# Patient Record
Sex: Female | Born: 1970 | Race: White | Hispanic: No | Marital: Married | Smoking: Never smoker
Health system: Southern US, Community
[De-identification: ages and names within clinical notes are randomized; demographics above are authoritative.]

## PROBLEM LIST (undated history)

## (undated) DIAGNOSIS — F419 Anxiety disorder, unspecified: Secondary | ICD-10-CM

## (undated) DIAGNOSIS — R51 Headache: Secondary | ICD-10-CM

## (undated) HISTORY — PX: OVARIAN CYST REMOVAL: SHX89

## (undated) HISTORY — PX: APPENDECTOMY: SHX54

---

## 2001-06-15 ENCOUNTER — Other Ambulatory Visit: Admission: RE | Admit: 2001-06-15 | Discharge: 2001-06-15 | Payer: Self-pay | Admitting: Obstetrics and Gynecology

## 2002-01-21 ENCOUNTER — Inpatient Hospital Stay (HOSPITAL_COMMUNITY): Admission: AD | Admit: 2002-01-21 | Discharge: 2002-01-23 | Payer: Self-pay | Admitting: Obstetrics and Gynecology

## 2002-03-03 ENCOUNTER — Other Ambulatory Visit: Admission: RE | Admit: 2002-03-03 | Discharge: 2002-03-03 | Payer: Self-pay | Admitting: Obstetrics and Gynecology

## 2002-04-21 ENCOUNTER — Inpatient Hospital Stay (HOSPITAL_COMMUNITY): Admission: RE | Admit: 2002-04-21 | Discharge: 2002-04-22 | Payer: Self-pay | Admitting: Obstetrics and Gynecology

## 2002-04-21 ENCOUNTER — Encounter (INDEPENDENT_AMBULATORY_CARE_PROVIDER_SITE_OTHER): Payer: Self-pay | Admitting: Specialist

## 2002-04-21 ENCOUNTER — Encounter (INDEPENDENT_AMBULATORY_CARE_PROVIDER_SITE_OTHER): Payer: Self-pay

## 2003-04-25 ENCOUNTER — Other Ambulatory Visit: Admission: RE | Admit: 2003-04-25 | Discharge: 2003-04-25 | Payer: Self-pay | Admitting: Obstetrics and Gynecology

## 2003-10-31 ENCOUNTER — Inpatient Hospital Stay (HOSPITAL_COMMUNITY): Admission: AD | Admit: 2003-10-31 | Discharge: 2003-11-02 | Payer: Self-pay | Admitting: Obstetrics and Gynecology

## 2007-10-27 ENCOUNTER — Encounter: Admission: RE | Admit: 2007-10-27 | Discharge: 2007-10-27 | Payer: Self-pay | Admitting: Obstetrics and Gynecology

## 2010-09-17 ENCOUNTER — Other Ambulatory Visit: Payer: Self-pay | Admitting: Obstetrics and Gynecology

## 2010-10-05 NOTE — Op Note (Signed)
   NAME:  Nina Fowler, Nina Fowler                         ACCOUNT NO.:  [id number]   MEDICAL RECORD NO.:  [id number]                   PATIENT TYPE:  INP   LOCATION:  9164                                 FACILITY:  [facility]   PHYSICIAN:  Lenoard Aden, M.D.             DATE OF BIRTH:  31-Jan-1971   DATE OF PROCEDURE:  01/21/2002  DATE OF DISCHARGE:                                 OPERATIVE REPORT   PREOPERATIVE DIAGNOSES:  1. Recurrent variable decelerations.  2. Maternal exhaustion.   POSTOPERATIVE DIAGNOSES:  1. Recurrent variable decelerations.  2. Maternal exhaustion.   DESCRIPTION OF PROCEDURE:  The fetal vertex at fully dilated OP and +3  station, straight OP.  Mityvac vacuum system offered due to persistent  variable decelerations and maternal exhaustion.  Risks and benefits of  vacuum again include scalp laceration, cephalohematoma, and possible  intracranial hemorrhage noted.  The patient and husband acknowledge and  desire to proceed.  The Mityvac mushroom cup is placed in the proper  location for two pulls over second-degree midline laceration of full term  living female, Apgars 9 and 10,  bulb suctioned on the perineum, nuchal cord  x1 loosely applied and not reduced.  No cervical laceration.  A second-  degree midline laceration was repaired with a 3-0 Vicryl Rapide without  complications.  Estimated blood loss 500 cc.  Mother and baby recovering  without difficulty.                                               Lenoard Aden, M.D.    RJT/MEDQ  D:  01/21/2002  T:  01/21/2002  Job:  [id number]

## 2010-10-05 NOTE — H&P (Signed)
NAME:  Nina Fowler, Nina Fowler                         ACCOUNT NO.:  [id number]   MEDICAL RECORD NO.:  [id number]                   PATIENT TYPE:  INP   LOCATION:  NA                                   [facility]AC[facility]   PHYSICIAN:  Juan H. Lily Peer, M.D.             DATE OF BIRTH:  1970-09-21   DATE OF ADMISSION:  04/12/2002  DATE OF DISCHARGE:                                HISTORY & PHYSICAL   CHIEF COMPLAINT:  1. Term intrauterine pregnancy at 39-5/7 weeks' gestation.  2. Suspected fetal macrosomia.  3. Maternal obesity.   HISTORY OF PRESENT ILLNESS:  The patient is a 40 year old gravida 3, para 2,  with an estimated date of confinement April 14, 2002, currently 39-5/7  weeks' gestation.  The patient presented to the office today, is instructed  for a prenatal visit but also for an ultrasound to determine estimated fetal  weight.  The patient herself weighs 347 pounds, had two previous normal  spontaneous vaginal deliveries of 8 pounds 10 ounces at 40 weeks and 8  pounds 14 ounces at 41 weeks with previous pregnancies.  An ultrasound today  demonstrated an estimated fetal weight of 4140 g in the 85th percentile for  40 weeks' gestation.  The AFI was normal and was 14.6 cm, the 60th  percentile for 40 weeks, vertex presentation.  The patient had had a  hemoglobin A1c early in her pregnancy due to a strong family history of  diabetes and due to herself being morbidly obese.  Her diabetes screen at 24  weeks was normal.  She also had history of hypothyroidism, for which she has  been on 75 mcg daily and has had her thyroid function tests periodically  during her pregnancy.  Anatomy screen on ultrasound early in her pregnancy  was reported to be normal.  CBC had a small sub __________ hematoma which  subsequently resolved by the 20 week ultrasound.   PAST MEDICAL HISTORY:  1. Previous normal spontaneous vaginal delivery, 8 pounds 10 ounces, at 40     weeks' gestation, female infant in  1997, and an 8 pound 14 ounce female at 41     weeks in 2000.  The patient is allergic to PENICILLIN.  Her GBS culture     was negative.  She is on Synthroid 75 mg q.d.  2. 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   REVIEW OF SYSTEMS:  See [place]ister form.   PHYSICAL EXAMINATION:  VITAL SIGNS:  The patient weighs 347 pounds.  Blood  pressure was 110/70.  HEENT:  Unremarkable.  NECK:  Supple.  Trachea in midline.  No carotid bruits.  No thyromegaly.  LUNGS:  Clear to auscultation.  No rhonchi or wheezes.  HEART:  Regular rate and rhythm.  No murmurs or gallops.  BREASTS:  Exam not done.  ABDOMEN:  Morbidly obese.  Difficult by Thayer Ohm maneuver to determine  presentation.  PELVIC:  Cervix was 1 cm, 50%, -3 station.  EXTREMITIES:  DTRs 1+, negative clonus.   LABORATORY DATA:  Prenatal labs:  A positive blood type, negative antibody  screen.  VDRL was nonreactive, hepatitis B surface antigen and HIV were  negative, rubella immune.  __________ was normal, diabetes screen was  normal.  Alpha- fetoprotein was normal.  GBS culture was negative.   ASSESSMENT:  A 40 year old gravida 3, para 2, at 39-5/7 weeks' gestation  being admitted for induction.  Patient with suspected fetal macrosomia at  4140 g (9 pounds 2 ounces) in the 85th percentile for 40 weeks.  The patient  was counseled as to the 15% variability that can occur in ultrasounds,  whether she may be over 9 pounds 2 ounces or under, and offers were given to  electively consider a cesarean section versus a trial of vaginal delivery.  She has delivered two large babies in the past, but not at this weight, and  is fully aware of potential risks to include shoulder dystocia which could  subsequently result in neurological problems, such as Erb palsy, also  cerebral palsy, difficult birth resulting in emergency cesarean section.  All of these issues were discussed with the patient and she is fully aware  and  wanted to go  ahead with a trial of labor.  Since her cervix is only 1  cm and 50% she will have Cervidil placed this evening and leave it on for 10-  12 hours and begin Pitocin induction through her IV in the morning.  All  questions were answered and they feel comfortable with this decision.   PLAN:  As per assessment above.                                               Juan H. Lily Peer, M.D.    JHF/MEDQ  D:  04/12/2002  T:  04/12/2002  Job:  [id number]

## 2010-10-05 NOTE — H&P (Signed)
   NAME:  Nina Fowler, Nina Fowler                         ACCOUNT NO.:  [id number]   MEDICAL RECORD NO.:  [id number]                   PATIENT TYPE:  INP   LOCATION:  9164                                 FACILITY:  [facility]   PHYSICIAN:  Lenoard Aden, M.D.             DATE OF BIRTH:  September 01, 1970   DATE OF ADMISSION:  01/21/2002  DATE OF DISCHARGE:                                HISTORY & PHYSICAL   CHIEF COMPLAINT:  Oligohydramnios.   HISTORY OF PRESENT ILLNESS:  A 40 year old white female, G-1, P-0 expected  day of delivery 01/24/2002 39+ weeks with oligohydramnios.   PAST MEDICAL HISTORY:  Noncontributory except for migraine headaches. No  medical or surgical hospitalizations outside of pregnancy.   FAMILY HISTORY:  Noncontributory.   ALLERGIES:  No known drug allergies.   MEDICATIONS:  Prenatal vitamins.   COMPLICATIONS:  Pregnancy complicated by a stable 10 x 12 cm simple ovarian  cyst which is persistent and noted on repeat ultrasound surveillance.  No  evidence of internal/external excrescences noted diagnosed during pregnancy.   PHYSICAL EXAMINATION:  On physical exam she is a well-developed, well-  nourished white female in no apparent distress.  HEENT:  Normal.  LUNGS:  Clear.  HEART:  Regular rate and rhythm.  ABDOMEN:  Soft, gravid, nontender.  Estimated fetal weight 7-1/2 pounds.  Cervix is 2 cm, 90% vertex, 0 expression.  EXTREMITIES:  No cords in the upper extremities.  NEUROLOGIC:  Nonfocal.   IMPRESSION:  1. A 39 week intrauterine pregnancy.  2. Oligohydramnios.  3. Persistent simple ovarian mass.   PLAN:  Proceed with labor induction and C-section. When this is performed we  will address ovarian cyst. Will otherwise follow up with ultrasound in two  to three months postpartum and management assessed as needed.                                               Lenoard Aden, M.D.   RJT/MEDQ  D:  01/21/2002  T:  01/21/2002  Job:  [id number]

## 2010-10-05 NOTE — H&P (Signed)
   NAME:  Nina Fowler, Nina Fowler                         ACCOUNT NO.:  [id number]   MEDICAL RECORD NO.:  [id number]                   PATIENT TYPE:  INP   LOCATION:  NA                                   [facility]   PHYSICIAN:  Lenoard Aden, M.D.             DATE OF BIRTH:  Jun 23, 1970   DATE OF ADMISSION:  04/21/2002  DATE OF DISCHARGE:                                HISTORY & PHYSICAL   CHIEF COMPLAINT:  Ovarian mass.   HISTORY OF PRESENT ILLNESS:  The patient is a 40 year old, white female, G1,  P1, status post vaginal delivery in September 2003, with persistent ovarian  mass for definitive evaluation.   PAST MEDICAL HISTORY:  1. Spontaneous vaginal delivery.  2. Migraine headaches.   PAST GYNECOLOGIC HISTORY:  Long-term pregnancy history dating back to  January with persistent ovarian mass 12 x 14 cm in size, simple appearing  that appears to be emanating from the right ovary.  Normal size uterus and  left ovary are visualized.   FAMILY HISTORY:  Noncontributory.   ALLERGIES:  No known drug allergies.   MEDICATIONS:  Prenatal vitamins.   PHYSICAL EXAMINATION:  GENERAL:  She is a well-developed, well-nourished,  white female in no apparent distress.  HEENT:  Normal.  LUNGS:  Clear.  HEART:  Regular rate and rhythm.  ABDOMEN:  Soft, nontender.  PELVIC:  Midline pelvic mass measuring about 10 x 14 cm in size.  EXTREMITIES:  No cords.  NEUROLOGIC:  Nonfocal.   IMPRESSION:  Persistent ovarian mass with questionable benign etiology.   PLAN:  Proceed with exploratory laparotomy, ovarian cystectomy versus  salpingo-oophorectomy.  Risks of anesthesia, infection, bleeding, injury to  abdominal organs were discussed.  Delayed risks and complications include  bowel and bladder injury and need for repair noted.  The patient  acknowledges and wishes to proceed.                                                Lenoard Aden, M.D.   RJT/MEDQ  D:  04/21/2002  T:   04/21/2002  Job:  [id number]

## 2010-10-05 NOTE — H&P (Signed)
NAME:  Nina Fowler, Nina Fowler                         ACCOUNT NO.:  [id number]   MEDICAL RECORD NO.:  [id number]                   PATIENT TYPE:  INP   LOCATION:  9165                                 [facility]ILITY[facility]   PHYSICIAN:  Lenoard Aden, M.D.             DATE OF BIRTH:  April 16, 1971   DATE OF ADMISSION:  10/31/2003  DATE OF DISCHARGE:                                HISTORY & PHYSICAL   CHIEF COMPLAINT:  Rule out LGA, for induction.   HISTORY OF PRESENT ILLNESS:  The patient is a 40 year old white female G2,  P53, EDD of November 10, 2003 at 38+ weeks with an estimated fetal weight greater  than the 90th percentile who presents for induction of labor.   History of spontaneous vaginal delivery 7 pounds, 11 ounce female 2003.  History of right oophorectomy for benign ovarian cyst, persistent in 12/03.   ALLERGIES:  NO KNOWN DRUG ALLERGIES.   MEDICATIONS:  Prenatal vitamins.   PAST MEDICAL HISTORY:  No other medical or surgical hospitalizations, other  than previous described outside pregnancy.  Pregnancy course complicated  only by an estimated fetal weight greater than the 90th percentile.   PHYSICAL EXAMINATION:  GENERAL:  She is a well-developed, well-nourished  white female in no acute distress.  LUNGS:  Clear.  CARDIOVASCULAR:  Regular rate and rhythm.  ABDOMEN:  Soft, gravida nontender.  Estimated fetal weight 8-1/2 pounds.  PELVIC:  Cervix is 3-4 cm, 80% vertex and -1.  EXTREMITIES:  No cords.  NEUROLOGIC:  Nonfocal.   IMPRESSION:  1. A 38 weeks' intrauterine pregnancy.  2. Presumed large for gestational age, for induction.   PLAN:  Proceed with inductions. Risks and benefits were discussed, potential  inaccuracy of estimated fetal weight by ultrasound, discussed with patient  and husband. Due to favorable cervix, they wished to proceed.                                               Lenoard Aden, M.D.    RJT/MEDQ  D:  10/31/2003  T:  10/31/2003  Job:  [id number]   cc:   [facility]

## 2010-10-05 NOTE — Op Note (Signed)
NAME:  XOCHILTH, STANDISH                         ACCOUNT NO.:  [id number]   MEDICAL RECORD NO.:  [id number]                   PATIENT TYPE:  INP   LOCATION:  9313                                 [facility]   PHYSICIAN:  Lenoard Aden, M.D.             DATE OF BIRTH:  [birth year]   DATE OF PROCEDURE:  04/21/2002  DATE OF DISCHARGE:                                 OPERATIVE REPORT   CHIEF COMPLAINT:  Pelvic mass, right ovarian cyst, postoperative right  mucinous cystadenoma.   PROCEDURE:  Exploratory laparotomy, pelvic washing, cyst aspiration, right  oophorectomy.   SURGEON:  Lenoard Aden, M.D.   ASSISTANT:  Pershing Cox, M.D.   ESTIMATED BLOOD LOSS:  Less than 50 cc.   COMPLICATIONS:  None.   DRAINS:  Foley catheter.   COUNTS:  Correct.   DISPOSITION:  The patient was taken to the recovery room in good condition.   DESCRIPTION OF PROCEDURE:  After being apprised of the risks of anesthesia,  infection, bleeding, injury to abdominal organs with need for repair, the  patient was brought to the operating room where she was administered general  anesthetic without complications, prepped and draped in sterile fashion,  Foley catheter placed.  After achieving adequate anesthesia, the Marcaine  solution placed in the area of a small midline incision which is moved with  a scalpel, carried down to the fascia, nicked in the midline, opened  vertically using Mayo scissors.  Rectus muscle was dissected sharply.  Peritoneum entered sharply.  Pelvic washing collecting using 500 to 600 cc  of lactated Ringers solution, collected with pool suctioning, 400 cc  collected total.  Mass noted to be tenting against the anterior abdominal  wall.  After pursestring was placed using 2-0 Vicryl suture and paracentesis  needle was placed and aspiration of mucinous appearing clear fluid  approximately 400 cc was aspirated and sent to pathology for cytology.  The  cyst is then  elevated out of a small midline incision and found to be  emanating from the right ovary.  Right ovarian normal tissue is not  identified.  Normal right tube, normal left tube, normal left ovary, normal  sized uterus.  No evidence of excrescences or pelvic implants at this time.  The base of the ovary is clamped using two Kelly clamps, removed and suture  ligated. The base is cauterized using electrocautery.  Good hemostasis is  noted.  Normal right tube is noted.  Normal left tube and ovary as  previously noted above.  Irrigation is accomplished.  Fascia closed using 0  Vicryl in tense running fashion.  Skin closed using 5-0 Monocryl.  Steri-  Strips placed.  The patient tolerated the procedure well and was transferred  to recovery room in good condition.  Lenoard Aden, M.D.     RJT/MEDQ  D:  04/21/2002  T:  04/21/2002  Job:  [id number]

## 2012-09-23 ENCOUNTER — Ambulatory Visit: Payer: Self-pay | Admitting: Obstetrics and Gynecology

## 2012-09-29 ENCOUNTER — Ambulatory Visit: Payer: Self-pay | Admitting: Obstetrics and Gynecology

## 2013-02-25 ENCOUNTER — Inpatient Hospital Stay (HOSPITAL_COMMUNITY): Admission: AD | Admit: 2013-02-25 | Payer: Self-pay | Source: Ambulatory Visit | Admitting: Obstetrics and Gynecology

## 2013-02-25 LAB — OB RESULTS CONSOLE GC/CHLAMYDIA
CHLAMYDIA, DNA PROBE: NEGATIVE
GC PROBE AMP, GENITAL: NEGATIVE

## 2013-03-03 LAB — OB RESULTS CONSOLE ABO/RH: RH Type: POSITIVE

## 2013-03-03 LAB — OB RESULTS CONSOLE ANTIBODY SCREEN: ANTIBODY SCREEN: NEGATIVE

## 2013-03-03 LAB — OB RESULTS CONSOLE RUBELLA ANTIBODY, IGM: Rubella: IMMUNE

## 2013-03-03 LAB — OB RESULTS CONSOLE HIV ANTIBODY (ROUTINE TESTING): HIV: NONREACTIVE

## 2013-03-03 LAB — OB RESULTS CONSOLE HEPATITIS B SURFACE ANTIGEN: HEP B S AG: NEGATIVE

## 2013-03-03 LAB — OB RESULTS CONSOLE RPR: RPR: NONREACTIVE

## 2013-05-20 NOTE — L&D Delivery Note (Signed)
Delivery Note At 11:11 AM a viable and healthy female was delivered via  (Presentation:ROA ).  APGAR: 9,9 ; weight pending.   Placenta status: spontaneous, intact.  Cord:  with the following complications: na.  Cord pH: na  Anesthesia: Epidural  Episiotomy: none Lacerations: second, periurrethral Suture Repair: 2.0 vicryl rapide Est. Blood Loss (mL): 200  Mom to postpartum.  Baby to Couplet care / Skin to Skin.  Nina Fowler 09/12/2013, 11:27 AM

## 2013-07-13 ENCOUNTER — Ambulatory Visit: Payer: Self-pay | Admitting: Internal Medicine

## 2013-07-13 LAB — RAPID STREP-A WITH REFLX: Micro Text Report: NEGATIVE

## 2013-07-17 LAB — BETA STREP CULTURE(ARMC)

## 2013-08-10 LAB — OB RESULTS CONSOLE GBS: STREP GROUP B AG: POSITIVE

## 2013-08-31 ENCOUNTER — Encounter (HOSPITAL_COMMUNITY): Payer: Self-pay | Admitting: *Deleted

## 2013-08-31 ENCOUNTER — Telehealth (HOSPITAL_COMMUNITY): Payer: Self-pay | Admitting: *Deleted

## 2013-08-31 NOTE — Telephone Encounter (Signed)
Preadmission screen  

## 2013-09-01 ENCOUNTER — Other Ambulatory Visit: Payer: Self-pay | Admitting: Obstetrics and Gynecology

## 2013-09-11 ENCOUNTER — Encounter (HOSPITAL_COMMUNITY): Payer: Self-pay

## 2013-09-11 ENCOUNTER — Inpatient Hospital Stay (HOSPITAL_COMMUNITY)
Admission: RE | Admit: 2013-09-11 | Discharge: 2013-09-14 | DRG: 775 | Disposition: A | Discharge status: Home / Self Care | Payer: BC Managed Care – PPO | Source: Ambulatory Visit | Attending: Obstetrics and Gynecology | Admitting: Obstetrics and Gynecology

## 2013-09-11 DIAGNOSIS — O3660X0 Maternal care for excessive fetal growth, unspecified trimester, not applicable or unspecified: Secondary | ICD-10-CM | POA: Diagnosis present

## 2013-09-11 DIAGNOSIS — O99892 Other specified diseases and conditions complicating childbirth: Secondary | ICD-10-CM | POA: Diagnosis present

## 2013-09-11 DIAGNOSIS — O09529 Supervision of elderly multigravida, unspecified trimester: Secondary | ICD-10-CM | POA: Diagnosis present

## 2013-09-11 DIAGNOSIS — Z3490 Encounter for supervision of normal pregnancy, unspecified, unspecified trimester: Secondary | ICD-10-CM

## 2013-09-11 DIAGNOSIS — Z22330 Carrier of Group B streptococcus: Secondary | ICD-10-CM

## 2013-09-11 DIAGNOSIS — O9989 Other specified diseases and conditions complicating pregnancy, childbirth and the puerperium: Secondary | ICD-10-CM

## 2013-09-11 DIAGNOSIS — O409XX0 Polyhydramnios, unspecified trimester, not applicable or unspecified: Secondary | ICD-10-CM | POA: Diagnosis present

## 2013-09-11 HISTORY — DX: Anxiety disorder, unspecified: F41.9

## 2013-09-11 HISTORY — DX: Headache: R51

## 2013-09-11 LAB — CBC
HCT: 37.7 % (ref 36.0–46.0)
Hemoglobin: 13 g/dL (ref 12.0–15.0)
MCH: 33 pg (ref 26.0–34.0)
MCHC: 34.5 g/dL (ref 30.0–36.0)
MCV: 95.7 fL (ref 78.0–100.0)
PLATELETS: 149 10*3/uL — AB (ref 150–400)
RBC: 3.94 MIL/uL (ref 3.87–5.11)
RDW: 13.8 % (ref 11.5–15.5)
WBC: 10.2 10*3/uL (ref 4.0–10.5)

## 2013-09-11 MED ORDER — LIDOCAINE HCL (PF) 1 % IJ SOLN
30.0000 mL | INTRAMUSCULAR | Status: DC | PRN
  Filled 2013-09-11: qty 30

## 2013-09-11 MED ORDER — FLEET ENEMA 7-19 GM/118ML RE ENEM: 1.0000 | ENEMA | RECTAL | Status: DC | PRN

## 2013-09-11 MED ORDER — ZOLPIDEM TARTRATE 5 MG PO TABS: 5.0000 mg | ORAL_TABLET | Freq: Every evening | ORAL | Status: DC | PRN

## 2013-09-11 MED ORDER — OXYTOCIN BOLUS FROM INFUSION: 500.0000 mL | INTRAVENOUS | Status: DC

## 2013-09-11 MED ORDER — CITRIC ACID-SODIUM CITRATE 334-500 MG/5ML PO SOLN: 30.0000 mL | ORAL | Status: DC | PRN

## 2013-09-11 MED ORDER — ACETAMINOPHEN 325 MG PO TABS: 650.0000 mg | ORAL_TABLET | ORAL | Status: DC | PRN

## 2013-09-11 MED ORDER — IBUPROFEN 600 MG PO TABS: 600.0000 mg | ORAL_TABLET | Freq: Four times a day (QID) | ORAL | Status: DC | PRN

## 2013-09-11 MED ORDER — LACTATED RINGERS IV SOLN: 500.0000 mL | INTRAVENOUS | Status: DC | PRN

## 2013-09-11 MED ORDER — PENICILLIN G POTASSIUM 5000000 UNITS IJ SOLR
2.5000 10*6.[IU] | INTRAVENOUS | Status: DC
  Administered 2013-09-12: 2.5 10*6.[IU] via INTRAVENOUS
  Filled 2013-09-11 (×5): qty 2.5

## 2013-09-11 MED ORDER — TERBUTALINE SULFATE 1 MG/ML IJ SOLN: 0.2500 mg | Freq: Once | INTRAMUSCULAR | Status: AC | PRN

## 2013-09-11 MED ORDER — OXYTOCIN 40 UNITS IN LACTATED RINGERS INFUSION - SIMPLE MED: 62.5000 mL/h | INTRAVENOUS | Status: DC

## 2013-09-11 MED ORDER — OXYTOCIN 40 UNITS IN LACTATED RINGERS INFUSION - SIMPLE MED
1.0000 m[IU]/min | INTRAVENOUS | Status: DC
  Administered 2013-09-12: 8 m[IU]/min via INTRAVENOUS
  Administered 2013-09-12: 2 m[IU]/min via INTRAVENOUS
  Administered 2013-09-12: 666 m[IU]/min via INTRAVENOUS
  Administered 2013-09-12: 10 m[IU]/min via INTRAVENOUS
  Filled 2013-09-11: qty 1000

## 2013-09-11 MED ORDER — ONDANSETRON HCL 4 MG/2ML IJ SOLN: 4.0000 mg | Freq: Four times a day (QID) | INTRAMUSCULAR | Status: DC | PRN

## 2013-09-11 MED ORDER — OXYCODONE-ACETAMINOPHEN 5-325 MG PO TABS: 1.0000 | ORAL_TABLET | ORAL | Status: DC | PRN

## 2013-09-11 MED ORDER — PENICILLIN G POTASSIUM 5000000 UNITS IJ SOLR
5.0000 10*6.[IU] | Freq: Once | INTRAVENOUS | Status: AC
  Administered 2013-09-12: 5 10*6.[IU] via INTRAVENOUS
  Filled 2013-09-11: qty 5

## 2013-09-11 MED ORDER — MISOPROSTOL 25 MCG QUARTER TABLET
25.0000 ug | ORAL_TABLET | ORAL | Status: DC | PRN
  Administered 2013-09-11 – 2013-09-12 (×2): 25 ug via VAGINAL
  Filled 2013-09-11: qty 0.25
  Filled 2013-09-11: qty 1
  Filled 2013-09-11: qty 0.25

## 2013-09-11 MED ORDER — LACTATED RINGERS IV SOLN
INTRAVENOUS | Status: DC
  Administered 2013-09-11 – 2013-09-12 (×2): via INTRAVENOUS

## 2013-09-11 NOTE — H&P (Signed)
Nina Fowler is a 43 y.o. female presenting for induction due to AMA > 40 at 39+ weeks. Maternal Medical History:  Contractions: Frequency: rare.   Perceived severity is mild.    Fetal activity: Perceived fetal activity is normal.   Last perceived fetal movement was within the past hour.    Prenatal complications: no prenatal complications Prenatal Complications - Diabetes: none.    OB History   Grav Para Term Preterm Abortions TAB SAB Ect Mult Living   3 2 2       2      No past medical history on file. No past surgical history on file. Family History: family history includes Cancer in her brother and maternal grandfather; Hypertension in her father and mother; Lupus in her brother; Stroke in her father. Social History:  reports that she has never smoked. She has never used smokeless tobacco. She reports that she does not drink alcohol or use illicit drugs.   Prenatal Transfer Tool  Maternal Diabetes: No Genetic Screening: Normal Maternal Ultrasounds/Referrals: Normal Fetal Ultrasounds or other Referrals:  None Maternal Substance Abuse:  No Significant Maternal Medications:  None Significant Maternal Lab Results:  None Other Comments:  None  Review of Systems  All other systems reviewed and are negative.     Last menstrual period 12/20/2012. Maternal Exam:  Uterine Assessment: Contraction strength is mild.  Contraction frequency is rare.   Abdomen: Patient reports no abdominal tenderness. Fetal presentation: vertex  Introitus: Normal vulva. Normal vagina.  Ferning test: not done.  Nitrazine test: not done. Amniotic fluid character: not assessed.  Pelvis: adequate for delivery.   Cervix: Cervix evaluated by digital exam.     Physical Exam  Constitutional: She is oriented to person, place, and time. She appears well-developed and well-nourished.  Eyes: Pupils are equal, round, and reactive to light.  Neck: Normal range of motion.  Cardiovascular: Normal rate  and regular rhythm.   Respiratory: Effort normal and breath sounds normal.  GI: Soft. Bowel sounds are normal.  Genitourinary: Vagina normal and uterus normal.  Musculoskeletal: Normal range of motion.  Neurological: She is alert and oriented to person, place, and time. She has normal reflexes.  Skin: Skin is warm and dry.  Psychiatric: She has a normal mood and affect.    Prenatal labs: ABO, Rh: O/Positive/-- (10/15 0000) Antibody: Negative (10/15 0000) Rubella: Immune (10/15 0000) RPR: Nonreactive (10/15 0000)  HBsAg: Negative (10/15 0000)  HIV: Non-reactive (10/15 0000)  GBS: Positive (03/24 0000)   Assessment/Plan: 39 weeks AMA LGA GBS positive Cytotec then Pitocin in AM   Rhythm Gubbels J Terrall Bley 09/11/2013, 7:55 PM

## 2013-09-12 ENCOUNTER — Encounter (HOSPITAL_COMMUNITY): Payer: Self-pay

## 2013-09-12 ENCOUNTER — Encounter (HOSPITAL_COMMUNITY): Payer: BC Managed Care – PPO | Admitting: Anesthesiology

## 2013-09-12 ENCOUNTER — Inpatient Hospital Stay (HOSPITAL_COMMUNITY): Payer: BC Managed Care – PPO | Admitting: Anesthesiology

## 2013-09-12 DIAGNOSIS — O409XX0 Polyhydramnios, unspecified trimester, not applicable or unspecified: Secondary | ICD-10-CM | POA: Diagnosis present

## 2013-09-12 LAB — RPR

## 2013-09-12 LAB — ABO/RH: ABO/RH(D): O POS

## 2013-09-12 MED ORDER — PRENATAL MULTIVITAMIN CH
1.0000 | ORAL_TABLET | Freq: Every day | ORAL | Status: DC
  Administered 2013-09-13 – 2013-09-14 (×2): 1 via ORAL
  Filled 2013-09-12 (×2): qty 1

## 2013-09-12 MED ORDER — EPHEDRINE 5 MG/ML INJ: 10.0000 mg | INTRAVENOUS | Status: DC | PRN

## 2013-09-12 MED ORDER — FENTANYL 2.5 MCG/ML BUPIVACAINE 1/10 % EPIDURAL INFUSION (WH - ANES)
INTRAMUSCULAR | Status: DC | PRN
  Administered 2013-09-12: 14 mL/h via EPIDURAL

## 2013-09-12 MED ORDER — LANOLIN HYDROUS EX OINT: TOPICAL_OINTMENT | CUTANEOUS | Status: DC | PRN

## 2013-09-12 MED ORDER — PHENYLEPHRINE 40 MCG/ML (10ML) SYRINGE FOR IV PUSH (FOR BLOOD PRESSURE SUPPORT): 80.0000 ug | PREFILLED_SYRINGE | INTRAVENOUS | Status: DC | PRN

## 2013-09-12 MED ORDER — METHYLERGONOVINE MALEATE 0.2 MG/ML IJ SOLN: 0.2000 mg | INTRAMUSCULAR | Status: DC | PRN

## 2013-09-12 MED ORDER — FENTANYL 2.5 MCG/ML BUPIVACAINE 1/10 % EPIDURAL INFUSION (WH - ANES)
14.0000 mL/h | INTRAMUSCULAR | Status: DC | PRN
  Filled 2013-09-12: qty 125

## 2013-09-12 MED ORDER — DIPHENHYDRAMINE HCL 50 MG/ML IJ SOLN: 12.5000 mg | INTRAMUSCULAR | Status: DC | PRN

## 2013-09-12 MED ORDER — WITCH HAZEL-GLYCERIN EX PADS
1.0000 "application " | MEDICATED_PAD | CUTANEOUS | Status: DC | PRN
  Administered 2013-09-12 – 2013-09-13 (×2): 1 via TOPICAL

## 2013-09-12 MED ORDER — EPHEDRINE 5 MG/ML INJ
10.0000 mg | INTRAVENOUS | Status: DC | PRN
  Filled 2013-09-12: qty 2
  Filled 2013-09-12: qty 4

## 2013-09-12 MED ORDER — OXYCODONE-ACETAMINOPHEN 5-325 MG PO TABS: 1.0000 | ORAL_TABLET | ORAL | Status: DC | PRN

## 2013-09-12 MED ORDER — LACTATED RINGERS IV SOLN
500.0000 mL | Freq: Once | INTRAVENOUS | Status: AC
  Administered 2013-09-12: 500 mL via INTRAVENOUS

## 2013-09-12 MED ORDER — DIPHENHYDRAMINE HCL 25 MG PO CAPS: 25.0000 mg | ORAL_CAPSULE | Freq: Four times a day (QID) | ORAL | Status: DC | PRN

## 2013-09-12 MED ORDER — IBUPROFEN 600 MG PO TABS
600.0000 mg | ORAL_TABLET | Freq: Four times a day (QID) | ORAL | Status: DC
  Administered 2013-09-12 – 2013-09-14 (×8): 600 mg via ORAL
  Filled 2013-09-12 (×8): qty 1

## 2013-09-12 MED ORDER — ONDANSETRON HCL 4 MG/2ML IJ SOLN: 4.0000 mg | INTRAMUSCULAR | Status: DC | PRN

## 2013-09-12 MED ORDER — FENTANYL 2.5 MCG/ML BUPIVACAINE 1/10 % EPIDURAL INFUSION (WH - ANES): 14.0000 mL/h | INTRAMUSCULAR | Status: DC | PRN

## 2013-09-12 MED ORDER — LACTATED RINGERS IV SOLN: 500.0000 mL | Freq: Once | INTRAVENOUS | Status: DC

## 2013-09-12 MED ORDER — PHENYLEPHRINE 40 MCG/ML (10ML) SYRINGE FOR IV PUSH (FOR BLOOD PRESSURE SUPPORT)
80.0000 ug | PREFILLED_SYRINGE | INTRAVENOUS | Status: DC | PRN
  Filled 2013-09-12: qty 2

## 2013-09-12 MED ORDER — DIBUCAINE 1 % RE OINT
1.0000 "application " | TOPICAL_OINTMENT | RECTAL | Status: DC | PRN
  Administered 2013-09-13: 1 via RECTAL
  Filled 2013-09-12: qty 28

## 2013-09-12 MED ORDER — SIMETHICONE 80 MG PO CHEW: 80.0000 mg | CHEWABLE_TABLET | ORAL | Status: DC | PRN

## 2013-09-12 MED ORDER — SENNOSIDES-DOCUSATE SODIUM 8.6-50 MG PO TABS
2.0000 | ORAL_TABLET | ORAL | Status: DC
  Administered 2013-09-12 – 2013-09-13 (×2): 2 via ORAL
  Filled 2013-09-12 (×2): qty 2

## 2013-09-12 MED ORDER — ONDANSETRON HCL 4 MG PO TABS: 4.0000 mg | ORAL_TABLET | ORAL | Status: DC | PRN

## 2013-09-12 MED ORDER — ZOLPIDEM TARTRATE 5 MG PO TABS: 5.0000 mg | ORAL_TABLET | Freq: Every evening | ORAL | Status: DC | PRN

## 2013-09-12 MED ORDER — BENZOCAINE-MENTHOL 20-0.5 % EX AERO
1.0000 "application " | INHALATION_SPRAY | CUTANEOUS | Status: DC | PRN
  Administered 2013-09-12: 1 via TOPICAL
  Filled 2013-09-12: qty 56

## 2013-09-12 MED ORDER — TETANUS-DIPHTH-ACELL PERTUSSIS 5-2.5-18.5 LF-MCG/0.5 IM SUSP: 0.5000 mL | Freq: Once | INTRAMUSCULAR | Status: DC

## 2013-09-12 MED ORDER — LIDOCAINE HCL (PF) 1 % IJ SOLN
INTRAMUSCULAR | Status: DC | PRN
  Administered 2013-09-12 (×2): 4 mL

## 2013-09-12 MED ORDER — EPHEDRINE 5 MG/ML INJ
10.0000 mg | INTRAVENOUS | Status: DC | PRN
  Filled 2013-09-12: qty 4
  Filled 2013-09-12: qty 2

## 2013-09-12 MED ORDER — METHYLERGONOVINE MALEATE 0.2 MG PO TABS: 0.2000 mg | ORAL_TABLET | ORAL | Status: DC | PRN

## 2013-09-12 MED ORDER — PHENYLEPHRINE 40 MCG/ML (10ML) SYRINGE FOR IV PUSH (FOR BLOOD PRESSURE SUPPORT)
80.0000 ug | PREFILLED_SYRINGE | INTRAVENOUS | Status: DC | PRN
  Filled 2013-09-12: qty 2
  Filled 2013-09-12: qty 10

## 2013-09-12 NOTE — Anesthesia Procedure Notes (Signed)
Epidural Patient location during procedure: OB Start time: 09/12/2013 8:45 AM End time: 09/12/2013 8:55 AM  Staffing Anesthesiologist: Lewie LoronGERMEROTH, Kristyanna Barcelo R Performed by: anesthesiologist   Preanesthetic Checklist Completed: patient identified, pre-op evaluation, timeout performed, IV checked, risks and benefits discussed and monitors and equipment checked  Epidural Patient position: sitting Prep: site prepped and draped and DuraPrep Patient monitoring: heart rate Approach: midline Injection technique: LOR air and LOR saline  Needle:  Needle type: Tuohy  Needle gauge: 17 G Needle length: 9 cm Needle insertion depth: 6 cm Catheter type: closed end flexible Catheter size: 19 Gauge Catheter at skin depth: 12 cm Test dose: negative  Assessment Sensory level: T8 Events: blood not aspirated, injection not painful, no injection resistance, negative IV test and no paresthesia  Additional Notes Reason for block:procedure for pain

## 2013-09-12 NOTE — Anesthesia Preprocedure Evaluation (Signed)
Anesthesia Evaluation  Patient identified by MRN, date of birth, ID band Patient awake    Reviewed: Allergy & Precautions, H&P , NPO status , Patient's Chart, lab work & pertinent test results  Airway Mallampati: II TM Distance: >3 FB     Dental   Pulmonary neg pulmonary ROS,          Cardiovascular negative cardio ROS  Rhythm:Regular     Neuro/Psych  Headaches, negative psych ROS   GI/Hepatic negative GI ROS, Neg liver ROS,   Endo/Other  negative endocrine ROS  Renal/GU negative Renal ROS  negative genitourinary   Musculoskeletal negative musculoskeletal ROS (+)   Abdominal   Peds negative pediatric ROS (+)  Hematology negative hematology ROS (+)   Anesthesia Other Findings   Reproductive/Obstetrics (+) Pregnancy                           Anesthesia Physical Anesthesia Plan  ASA: II  Anesthesia Plan: Epidural   Post-op Pain Management:    Induction:   Airway Management Planned:   Additional Equipment:   Intra-op Plan:   Post-operative Plan:   Informed Consent: I have reviewed the patients History and Physical, chart, labs and discussed the procedure including the risks, benefits and alternatives for the proposed anesthesia with the patient or authorized representative who has indicated his/her understanding and acceptance.     Plan Discussed with:   Anesthesia Plan Comments:         Anesthesia Quick Evaluation

## 2013-09-12 NOTE — Lactation Note (Signed)
This note was copied from the chart of Nina Stefano GaulSherry Maxham. Lactation Consultation Note  Patient Name: Nina Stefano GaulSherry Fowler ZOXWR'UToday's Date: 09/12/2013 Reason for consult: Other (Comment) (exclusion note)   Maternal Data Formula Feeding for Exclusion: Yes Reason for exclusion: Mother's choice to formula feed on admision  Feeding Feeding Type: Bottle Fed - Formula Nipple Type: Regular  LATCH Score/Interventions                      Lactation Tools Discussed/Used     Consult Status      Alfred LevinsChristine Anne Mckala Pantaleon 09/12/2013, 2:19 PM

## 2013-09-12 NOTE — Progress Notes (Signed)
Stefano GaulSherry Mullett is a 43 y.o. G3P2002 at 63104w5d by LMP admitted for induction of labor due to [facility] Al[facility] LLCMA and poly.  Subjective: Getting comfortable with epidural  Objective: BP 139/92  Pulse 80  Temp(Src) 98 F (36.7 C) (Oral)  Resp 18  Ht 5' 5.5" (1.664 m)  Wt 82.555 kg (182 lb)  BMI 29.82 kg/m2  SpO2 99%  LMP 12/20/2012      FHT:  FHR: 145 bpm, variability: moderate,  accelerations:  Present,  decelerations:  Absent UC:   regular, every 4 minutes SVE:   Dilation: 3 Effacement (%): 60;50 Station: -2 Exam by:: B.Cagna,RN  Labs: Lab Results  Component Value Date   WBC 10.2 09/11/2013   HGB 13.0 09/11/2013   HCT 37.7 09/11/2013   MCV 95.7 09/11/2013   PLT 149* 09/11/2013    Assessment / Plan: Induction of labor due to AMA and poly,  progressing well on pitocin  Labor: Progressing on Pitocin, will continue to increase then AROM Preeclampsia:  no signs or symptoms of toxicity Fetal Wellbeing:  Category I Pain Control:  Epidural I/D:  n/a Anticipated MOD:  NSVD  Lenoard AdenRichard J Judy Pollman 09/12/2013, 9:24 AM

## 2013-09-13 LAB — CBC
HCT: 36.7 % (ref 36.0–46.0)
Hemoglobin: 12.4 g/dL (ref 12.0–15.0)
MCH: 32.6 pg (ref 26.0–34.0)
MCHC: 33.8 g/dL (ref 30.0–36.0)
MCV: 96.6 fL (ref 78.0–100.0)
PLATELETS: 133 10*3/uL — AB (ref 150–400)
RBC: 3.8 MIL/uL — AB (ref 3.87–5.11)
RDW: 14.1 % (ref 11.5–15.5)
WBC: 10.9 10*3/uL — AB (ref 4.0–10.5)

## 2013-09-13 MED ORDER — SERTRALINE HCL 25 MG PO TABS
25.0000 mg | ORAL_TABLET | Freq: Every day | ORAL | Status: DC
  Administered 2013-09-14: 25 mg via ORAL
  Filled 2013-09-13 (×2): qty 1

## 2013-09-13 NOTE — Progress Notes (Signed)
PPD #1- SVD  Subjective:   Reports feeling well Desires to start prophylactic anti-depressant for h/o PPD No HA, visual changes, or epigastric pain Tolerating po/ No nausea or vomiting Bleeding is light Pain controlled with Motrin Up ad lib / ambulatory / voiding without problems Newborn:  Formula feeding  / Circumcision: planning   Objective:   VS:  VS:  Filed Vitals:   09/12/13 1400 09/12/13 1757 09/13/13 0156 09/13/13 0535  BP: 134/78 136/84 135/86 131/79  Pulse: 78 80 84 83  Temp: 97.9 F (36.6 C) 98.2 F (36.8 C) 97.3 F (36.3 C) 98 F (36.7 C)  TempSrc: Oral Oral Oral Oral  Resp: 18 18 20 18   Height:      Weight:      SpO2: 98% 97% 97% 94%    LABS:  Recent Labs  09/11/13 2005 09/13/13 0600  WBC 10.2 10.9*  HGB 13.0 12.4  PLT 149* 133*   Blood type: --/--/O POS (04/25 2005) Rubella: Immune (10/15 0000)   I&O: Intake/Output     04/26 0701 - 04/27 0700 04/27 0701 - 04/28 0700   Blood 200    Total Output 200     Net -200          Urine Occurrence 1 x      Physical Exam: Alert and oriented x3 Abdomen: soft, non-tender, non-distended  Fundus: firm, non-tender, U-2 Perineum: Well approximated, no significant erythema, edema, or drainage; healing well. Small hemorrhoid present. Lochia: small Extremities: no edema, no calf pain or tenderness    Assessment:  PPD # 1G3P3003/ S/P:induced vaginal, 2nd degree laceration H/o PPD  Borderline BP's Doing well    Plan: Continue routine post partum orders Start Zoloft, plan f/u in 2 wks in office  Continue to monitor BP's Anticipate D/C home tomorrow   Lawernce PittsMelanie N Aimee Heldman MSN, CNM 09/13/2013, 8:30 AM

## 2013-09-13 NOTE — Anesthesia Postprocedure Evaluation (Signed)
  Anesthesia Post-op Note  Patient: Stefano GaulSherry Dumas  Procedure(s) Performed: * No procedures listed *  Patient Location: PACU and Mother/Baby  Anesthesia Type:Epidural  Level of Consciousness: awake, alert  and oriented  Airway and Oxygen Therapy: Patient Spontanous Breathing  Post-op Pain: mild  Post-op Assessment: Post-op Vital signs reviewed  Post-op Vital Signs: Reviewed and stable  Last Vitals:  Filed Vitals:   09/13/13 0535  BP: 131/79  Pulse: 83  Temp: 36.7 C  Resp: 18    Complications: No apparent anesthesia complications

## 2013-09-14 MED ORDER — SERTRALINE HCL 25 MG PO TABS: 25.0000 mg | ORAL_TABLET | Freq: Every day | ORAL | Status: AC

## 2013-09-14 MED ORDER — IBUPROFEN 600 MG PO TABS: 600.0000 mg | ORAL_TABLET | Freq: Four times a day (QID) | ORAL | Status: AC

## 2013-09-14 NOTE — Discharge Instructions (Signed)
Postpartum Care After Vaginal Delivery °After you deliver your newborn (postpartum period), the usual stay in the hospital is 24 72 hours. If there were problems with your labor or delivery, or if you have other medical problems, you might be in the hospital longer.  °While you are in the hospital, you will receive help and instructions on how to care for yourself and your newborn during the postpartum period.  °While you are in the hospital: °· Be sure to tell your nurses if you have pain or discomfort, as well as where you feel the pain and what makes the pain worse. °· If you had an incision made near your vagina (episiotomy) or if you had some tearing during delivery, the nurses may put ice packs on your episiotomy or tear. The ice packs may help to reduce the pain and swelling. °· If you are breastfeeding, you may feel uncomfortable contractions of your uterus for a couple of weeks. This is normal. The contractions help your uterus get back to normal size. °· It is normal to have some bleeding after delivery. °· For the first 1 3 days after delivery, the flow is red and the amount may be similar to a period. °· It is common for the flow to start and stop. °· In the first few days, you may pass some small clots. Let your nurses know if you begin to pass large clots or your flow increases. °· Do not  flush blood clots down the toilet before having the nurse look at them. °· During the next 3 10 days after delivery, your flow should become more watery and pink or brown-tinged in color. °· Ten to fourteen days after delivery, your flow should be a small amount of yellowish-white discharge. °· The amount of your flow will decrease over the first few weeks after delivery. Your flow may stop in 6 8 weeks. Most women have had their flow stop by 12 weeks after delivery. °· You should change your sanitary pads frequently. °· Wash your hands thoroughly with soap and water for at least 20 seconds after changing pads, using  the toilet, or before holding or feeding your newborn. °· You should feel like you need to empty your bladder within the first 6 8 hours after delivery. °· In case you become weak, lightheaded, or faint, call your nurse before you get out of bed for the first time and before you take a shower for the first time. °· Within the first few days after delivery, your breasts may begin to feel tender and full. This is called engorgement. Breast tenderness usually goes away within 48 72 hours after engorgement occurs. You may also notice milk leaking from your breasts. If you are not breastfeeding, do not stimulate your breasts. Breast stimulation can make your breasts produce more milk. °· Spending as much time as possible with your newborn is very important. During this time, you and your newborn can feel close and get to know each other. Having your newborn stay in your room (rooming in) will help to strengthen the bond with your newborn.  It will give you time to get to know your newborn and become comfortable caring for your newborn. °· Your hormones change after delivery. Sometimes the hormone changes can temporarily cause you to feel sad or tearful. These feelings should not last more than a few days. If these feelings last longer than that, you should talk to your caregiver. °· If desired, talk to your caregiver about   methods of family planning or contraception. °· Talk to your caregiver about immunizations. Your caregiver may want you to have the following immunizations before leaving the hospital: °· Tetanus, diphtheria, and pertussis (Tdap) or tetanus and diphtheria (Td) immunization. It is very important that you and your family (including grandparents) or others caring for your newborn are up-to-date with the Tdap or Td immunizations. The Tdap or Td immunization can help protect your newborn from getting ill. °· Rubella immunization. °· Varicella (chickenpox) immunization. °· Influenza immunization. You should  receive this annual immunization if you did not receive the immunization during your pregnancy. °Document Released: 03/03/2007 Document Revised: 01/29/2012 Document Reviewed: 01/01/2012 °Exit[employer]® Patient Information ©2014 [employer]. ° °Postpartum Depression and Baby Blues °The postpartum period begins right after the birth of a baby. During this time, there is often a great amount of joy and excitement. It is also a time of considerable changes in the life of the parent(s). Regardless of how many times a mother gives birth, each child brings new challenges and dynamics to the family. It is not unusual to have feelings of excitement accompanied by confusing shifts in moods, emotions, and thoughts. All mothers are at risk of developing postpartum depression or the "baby blues." These mood changes can occur right after giving birth, or they may occur many months after giving birth. The baby blues or postpartum depression can be mild or severe. Additionally, postpartum depression can resolve rather quickly, or it can be a long-term condition. °CAUSES °Elevated hormones and their rapid decline are thought to be a main cause of postpartum depression and the baby blues. There are a number of hormones that radically change during and after pregnancy. Estrogen and progesterone usually decrease immediately after delivering your baby. The level of thyroid hormone and various cortisol steroids also rapidly drop. Other factors that play a major role in these changes include major life events and genetics.  °RISK FACTORS °If you have any of the following risks for the baby blues or postpartum depression, know what symptoms to watch out for during the postpartum period. Risk factors that may increase the likelihood of getting the baby blues or postpartum depression include: °· Having a personal or family history of depression. °· Having depression while being pregnant. °· Having premenstrual or oral contraceptive-associated  mood issues. °· Having exceptional life stress. °· Having marital conflict. °· Lacking a social support network. °· Having a baby with special needs. °· Having health problems such as diabetes. °SYMPTOMS °Baby blues symptoms include: °· Brief fluctuations in mood, such as going from extreme happiness to sadness. °· Decreased concentration. °· Difficulty sleeping. °· Crying spells, tearfulness. °· Irritability. °· Anxiety. °Postpartum depression symptoms typically begin within the first month after giving birth. These symptoms include: °· Difficulty sleeping or excessive sleepiness. °· Marked weight loss. °· Agitation. °· Feelings of worthlessness. °· Lack of interest in activity or food. °Postpartum psychosis is a very concerning condition and can be dangerous. Fortunately, it is rare. Displaying any of the following symptoms is cause for immediate medical attention. Postpartum psychosis symptoms include: °· Hallucinations and delusions. °· Bizarre or disorganized behavior. °· Confusion or disorientation. °DIAGNOSIS  °A diagnosis is made by an evaluation of your symptoms. There are no medical or lab tests that lead to a diagnosis, but there are various questionnaires that a caregiver may use to identify those with the baby blues, postpartum depression, or psychosis. Often times, a screening tool called the Edinburgh Postnatal Depression Scale is used to diagnose   in the postpartum period.  TREATMENT The baby blues usually goes away on its own in 1 to 2 weeks. Social support is often all that is needed. You should be encouraged to get adequate sleep and rest. Occasionally, you may be given medicines to help you sleep.  Postpartum depression requires treatment as it can last several months or longer if it is not treated. Treatment may include individual or group therapy, medicine, or both to address any social, physiological, and psychological factors that may play a role in the depression. Regular  exercise, a healthy diet, rest, and social support may also be strongly recommended.  Postpartum psychosis is more serious and needs treatment right away. Hospitalization is often needed. HOME CARE INSTRUCTIONS  Get as much rest as you can. Nap when the baby sleeps.  Exercise regularly. Some women find yoga and walking to be beneficial.  Eat a balanced and nourishing diet.  Do little things that you enjoy. Have a cup of tea, take a bubble bath, read your favorite magazine, or listen to your favorite music.  Avoid alcohol.  Ask for help with household chores, cooking, grocery shopping, or running errands as needed. Do not try to do everything.  Talk to people close to you about how you are feeling. Get support from your partner, family members, friends, or other new moms.  Try to stay positive in how you think. Think about the things you are grateful for.  Do not spend a lot of time alone.  Only take medicine as directed by your caregiver.  Keep all your postpartum appointments.  Let your caregiver know if you have any concerns. SEEK MEDICAL CARE IF: You are having a reaction or problems with your medicine. SEEK IMMEDIATE MEDICAL CARE IF:  You have suicidal feelings.  You feel you may harm the baby or someone else. Document Released: 02/08/2004 Document Revised: 07/29/2011 Document Reviewed: 02/15/2013 [facility] Patient Information 2014 [place], [place].

## 2013-09-14 NOTE — Progress Notes (Signed)
PPD #2- SVD  Subjective:   Reports feeling well Desires to start prophylactic anti-depressant for h/o PPD No HA, visual changes, or epigastric pain Tolerating po/ No nausea or vomiting Bleeding is light Pain controlled with Motrin Up ad lib / ambulatory / voiding without problems Newborn:  Formula feeding  / Circumcision: done   Objective:   VS:  VS:  Filed Vitals:   09/13/13 0156 09/13/13 0535 09/13/13 1818 09/14/13 0500  BP: 135/86 131/79 132/84 138/90  Pulse: 84 83 87 76  Temp: 97.3 F (36.3 C) 98 F (36.7 C) 98.1 F (36.7 C) 97.6 F (36.4 C)  TempSrc: Oral Oral Oral Oral  Resp: 20 18 18 20   Height:      Weight:      SpO2: 97% 94%      LABS:   Recent Labs  09/11/13 2005 09/13/13 0600  WBC 10.2 10.9*  HGB 13.0 12.4  PLT 149* 133*   Blood type: --/--/O POS (04/25 2005) Rubella: Immune (10/15 0000)   I&O: Intake/Output     04/27 0701 - 04/28 0700 04/28 0701 - 04/29 0700   Blood     Total Output       Net              Physical Exam: Alert and oriented x3 Abdomen: soft, non-tender, non-distended  Fundus: firm, non-tender, U-2 Perineum: Well approximated, no significant erythema, edema, or drainage; healing well. Small hemorrhoid present. Lochia: small Extremities: no edema, no calf pain or tenderness    Assessment:  PPD # 2 / [id number]/ S/P:induced vaginal, 2nd degree laceration H/o PPD  Borderline BP's  Doing well    Plan: Continue routine post partum orders Continue Zoloft, plan f/u in 2 wks in office  Continue to monitor BP's D/C home today   Kenard GowerRolitta M Charlean Carneal MSN, CNM 09/14/2013, 9:32 AM

## 2013-09-14 NOTE — Discharge Summary (Signed)
Obstetric Discharge Summary Reason for Admission: induction of labor due to AMA > 40 at 39+ weeks  Prenatal Procedures: ultrasound Intrapartum Procedures: spontaneous vaginal delivery Postpartum Procedures: none Complications-Operative and Postpartum: 2nd degree perineal and periurethral lacerations Hemoglobin  Date Value Ref Range Status  09/13/2013 12.4  12.0 - 15.0 g/dL Final     HCT  Date Value Ref Range Status  09/13/2013 36.7  36.0 - 46.0 % Final    Physical Exam:  General: alert, cooperative and no distress Lochia: appropriate Uterine Fundus: firm, midline, U-2 Perineum: 2nd degree repair healing well, no edema DVT Evaluation: No evidence of DVT seen on physical exam. Negative Homan's sign. No cords or calf tenderness. No significant calf/ankle edema.  Labor Course: Patient was admitted for induction of labor due to AMA > 40 at 39+ weeks / (+) GBS treatment while in labor / progressed rapidly to complete with Pitocin / SVD of viable female by Dr. Billy Coastaavon / no immediate postpartum complications noted   Discharge Diagnoses: Term Pregnancy-delivered        H/O PPD        Borderline BPs Discharge Information: Date: 09/14/2013 Activity: pelvic rest Diet: routine Medications: PNV, Ibuprofen and Zoloft Condition: stable Instructions: refer to practice specific booklet Discharge to: home Follow-up Information   [facility] Schedule an appointment as soon as possible for a visit in 1 week. (For blood pressure recheck)    Contact information:   [address] [place] [address] [phone]      Follow up with Lenoard AdenAAVON,RICHARD J, MD. Schedule an appointment as soon as possible for a visit in 2 weeks. (For Follow-up to use of anti-depressant for postpartum depression)    Specialty:  Obstetrics and Gynecology   Contact information:   [address] [place] [address] [phone]       Follow up with Lenoard AdenAAVON,RICHARD J, MD.  Schedule an appointment as soon as possible for a visit in 6 weeks. (For postpartum visit)    Specialty:  Obstetrics and Gynecology   Contact information:   [address] [place] [address] [phone]       Newborn Data: Live born female on 09/12/2013 Birth Weight: 7 lb 7.9 oz (3399 g) APGAR: 9, 10  Home with mother.  Kenard Gowerolitta M Tiphany Fayson, MSN, CNM 09/14/2013, 9:40 AM

## 2014-03-21 ENCOUNTER — Encounter (HOSPITAL_COMMUNITY): Payer: Self-pay

## 2014-11-17 DIAGNOSIS — F3341 Major depressive disorder, recurrent, in partial remission: Secondary | ICD-10-CM | POA: Insufficient documentation

## 2014-11-17 DIAGNOSIS — G43009 Migraine without aura, not intractable, without status migrainosus: Secondary | ICD-10-CM | POA: Insufficient documentation

## 2019-03-02 DIAGNOSIS — I10 Essential (primary) hypertension: Secondary | ICD-10-CM | POA: Insufficient documentation

## 2019-03-02 DIAGNOSIS — E538 Deficiency of other specified B group vitamins: Secondary | ICD-10-CM | POA: Insufficient documentation

## 2019-03-02 DIAGNOSIS — D508 Other iron deficiency anemias: Secondary | ICD-10-CM | POA: Insufficient documentation

## 2021-09-14 ENCOUNTER — Emergency Department: Payer: No Typology Code available for payment source

## 2021-09-14 ENCOUNTER — Other Ambulatory Visit: Payer: Self-pay

## 2021-09-14 ENCOUNTER — Other Ambulatory Visit: Payer: Self-pay | Admitting: Surgery

## 2021-09-14 ENCOUNTER — Observation Stay: Payer: No Typology Code available for payment source | Admitting: Certified Registered"

## 2021-09-14 ENCOUNTER — Observation Stay
Admission: EM | Admit: 2021-09-14 | Discharge: 2021-09-14 | Disposition: A | Discharge status: Home / Self Care | Payer: No Typology Code available for payment source | Attending: Surgery | Admitting: Surgery

## 2021-09-14 ENCOUNTER — Encounter
Admission: EM | Disposition: A | Discharge status: Home / Self Care | Payer: Self-pay | Source: Home / Self Care | Attending: Emergency Medicine

## 2021-09-14 DIAGNOSIS — K8000 Calculus of gallbladder with acute cholecystitis without obstruction: Secondary | ICD-10-CM

## 2021-09-14 DIAGNOSIS — K8020 Calculus of gallbladder without cholecystitis without obstruction: Secondary | ICD-10-CM | POA: Diagnosis not present

## 2021-09-14 DIAGNOSIS — K8012 Calculus of gallbladder with acute and chronic cholecystitis without obstruction: Principal | ICD-10-CM | POA: Insufficient documentation

## 2021-09-14 DIAGNOSIS — K810 Acute cholecystitis: Secondary | ICD-10-CM

## 2021-09-14 DIAGNOSIS — K819 Cholecystitis, unspecified: Secondary | ICD-10-CM

## 2021-09-14 DIAGNOSIS — R1011 Right upper quadrant pain: Secondary | ICD-10-CM | POA: Diagnosis present

## 2021-09-14 LAB — CBC
HCT: 47.1 % — ABNORMAL HIGH (ref 36.0–46.0)
Hemoglobin: 15.5 g/dL — ABNORMAL HIGH (ref 12.0–15.0)
MCH: 29.6 pg (ref 26.0–34.0)
MCHC: 32.9 g/dL (ref 30.0–36.0)
MCV: 90.1 fL (ref 80.0–100.0)
Platelets: 237 10*3/uL (ref 150–400)
RBC: 5.23 MIL/uL — ABNORMAL HIGH (ref 3.87–5.11)
RDW: 13.4 % (ref 11.5–15.5)
WBC: 6.9 10*3/uL (ref 4.0–10.5)
nRBC: 0 % (ref 0.0–0.2)

## 2021-09-14 LAB — POCT PREGNANCY, URINE: Preg Test, Ur: NEGATIVE

## 2021-09-14 LAB — LIPASE, BLOOD: Lipase: 51 U/L (ref 11–51)

## 2021-09-14 LAB — COMPREHENSIVE METABOLIC PANEL
ALT: 62 U/L — ABNORMAL HIGH (ref 0–44)
AST: 155 U/L — ABNORMAL HIGH (ref 15–41)
Albumin: 4.1 g/dL (ref 3.5–5.0)
Alkaline Phosphatase: 102 U/L (ref 38–126)
Anion gap: 10 (ref 5–15)
BUN: 10 mg/dL (ref 6–20)
CO2: 26 mmol/L (ref 22–32)
Calcium: 9.4 mg/dL (ref 8.9–10.3)
Chloride: 103 mmol/L (ref 98–111)
Creatinine, Ser: 0.79 mg/dL (ref 0.44–1.00)
GFR, Estimated: >60 mL/min (ref >60)
Glucose, Bld: 122 mg/dL — ABNORMAL HIGH (ref 70–99)
Potassium: 4.3 mmol/L (ref 3.5–5.1)
Sodium: 139 mmol/L (ref 135–145)
Total Bilirubin: 1.8 mg/dL — ABNORMAL HIGH (ref 0.3–1.2)
Total Protein: 7.8 g/dL (ref 6.5–8.1)

## 2021-09-14 SURGERY — CHOLECYSTECTOMY, ROBOT-ASSISTED, LAPAROSCOPIC
Anesthesia: General | Site: Abdomen

## 2021-09-14 MED ORDER — ACETAMINOPHEN 500 MG PO TABS: 1000.0000 mg | ORAL_TABLET | Freq: Four times a day (QID) | ORAL | Status: DC

## 2021-09-14 MED ORDER — INDOCYANINE GREEN 25 MG IV SOLR
2.5000 mg | Freq: Once | INTRAVENOUS | Status: AC
  Administered 2021-09-14: 2.5 mg via INTRAVENOUS
  Filled 2021-09-14: qty 10
  Filled 2021-09-14: qty 1

## 2021-09-14 MED ORDER — ONDANSETRON HCL 4 MG/2ML IJ SOLN: 4.0000 mg | Freq: Four times a day (QID) | INTRAMUSCULAR | Status: DC | PRN

## 2021-09-14 MED ORDER — SUCCINYLCHOLINE CHLORIDE 200 MG/10ML IV SOSY
PREFILLED_SYRINGE | INTRAVENOUS | Status: DC | PRN
Start: 2021-09-14 — End: 2021-09-14
  Administered 2021-09-14: 100 mg via INTRAVENOUS

## 2021-09-14 MED ORDER — BUPIVACAINE-MELOXICAM ER 200-6 MG/7ML IJ SOLN
INTRAMUSCULAR | Status: AC
  Filled 2021-09-14: qty 1

## 2021-09-14 MED ORDER — ONDANSETRON HCL 4 MG/2ML IJ SOLN: 4.0000 mg | Freq: Once | INTRAMUSCULAR | Status: DC | PRN

## 2021-09-14 MED ORDER — OXYCODONE HCL 5 MG PO TABS: 5.0000 mg | ORAL_TABLET | ORAL | Status: DC | PRN

## 2021-09-14 MED ORDER — ONDANSETRON HCL 4 MG/2ML IJ SOLN
4.0000 mg | INTRAMUSCULAR | Status: AC
  Administered 2021-09-14: 4 mg via INTRAVENOUS
  Filled 2021-09-14: qty 2

## 2021-09-14 MED ORDER — FENTANYL CITRATE (PF) 100 MCG/2ML IJ SOLN
INTRAMUSCULAR | Status: DC | PRN
  Administered 2021-09-14: 100 ug via INTRAVENOUS

## 2021-09-14 MED ORDER — MORPHINE SULFATE (PF) 4 MG/ML IV SOLN
4.0000 mg | Freq: Once | INTRAVENOUS | Status: AC
  Administered 2021-09-14: 4 mg via INTRAVENOUS
  Filled 2021-09-14: qty 1

## 2021-09-14 MED ORDER — SODIUM CHLORIDE 0.9 % IV SOLN: INTRAVENOUS | Status: DC

## 2021-09-14 MED ORDER — LACTATED RINGERS IV BOLUS
1000.0000 mL | Freq: Once | INTRAVENOUS | Status: AC
  Administered 2021-09-14: 1000 mL via INTRAVENOUS

## 2021-09-14 MED ORDER — MIDAZOLAM HCL 2 MG/2ML IJ SOLN
INTRAMUSCULAR | Status: AC
  Filled 2021-09-14: qty 2

## 2021-09-14 MED ORDER — KETOROLAC TROMETHAMINE 30 MG/ML IJ SOLN
INTRAMUSCULAR | Status: DC | PRN
  Administered 2021-09-14: 30 mg via INTRAVENOUS

## 2021-09-14 MED ORDER — ACETAMINOPHEN 10 MG/ML IV SOLN
INTRAVENOUS | Status: AC
  Filled 2021-09-14: qty 100

## 2021-09-14 MED ORDER — PROPOFOL 10 MG/ML IV BOLUS
INTRAVENOUS | Status: DC | PRN
  Administered 2021-09-14: 150 mg via INTRAVENOUS

## 2021-09-14 MED ORDER — ACETAMINOPHEN 10 MG/ML IV SOLN
INTRAVENOUS | Status: DC | PRN
Start: 2021-09-14 — End: 2021-09-14
  Administered 2021-09-14: 1000 mg via INTRAVENOUS

## 2021-09-14 MED ORDER — BUPIVACAINE-EPINEPHRINE (PF) 0.25% -1:200000 IJ SOLN
INTRAMUSCULAR | Status: AC
  Filled 2021-09-14: qty 30

## 2021-09-14 MED ORDER — MIDAZOLAM HCL 2 MG/2ML IJ SOLN
INTRAMUSCULAR | Status: DC | PRN
  Administered 2021-09-14: 2 mg via INTRAVENOUS

## 2021-09-14 MED ORDER — MORPHINE SULFATE (PF) 4 MG/ML IV SOLN
4.0000 mg | INTRAVENOUS | Status: DC | PRN
  Administered 2021-09-14: 4 mg via INTRAVENOUS
  Filled 2021-09-14: qty 1

## 2021-09-14 MED ORDER — LACTATED RINGERS IV SOLN: INTRAVENOUS | Status: DC | PRN

## 2021-09-14 MED ORDER — FENTANYL CITRATE (PF) 100 MCG/2ML IJ SOLN: 25.0000 ug | INTRAMUSCULAR | Status: DC | PRN

## 2021-09-14 MED ORDER — METOPROLOL TARTRATE 5 MG/5ML IV SOLN
INTRAVENOUS | Status: DC | PRN
  Administered 2021-09-14: 1 mg via INTRAVENOUS
  Administered 2021-09-14: 2 mg via INTRAVENOUS
  Administered 2021-09-14: 1 mg via INTRAVENOUS

## 2021-09-14 MED ORDER — DEXAMETHASONE SODIUM PHOSPHATE 10 MG/ML IJ SOLN
INTRAMUSCULAR | Status: DC | PRN
  Administered 2021-09-14: 10 mg via INTRAVENOUS

## 2021-09-14 MED ORDER — FENTANYL CITRATE (PF) 100 MCG/2ML IJ SOLN
INTRAMUSCULAR | Status: AC
  Filled 2021-09-14: qty 2

## 2021-09-14 MED ORDER — SODIUM CHLORIDE 0.9 % IV SOLN
2.0000 g | INTRAVENOUS | Status: DC
  Administered 2021-09-14: 2 g via INTRAVENOUS
  Filled 2021-09-14: qty 20

## 2021-09-14 MED ORDER — ONDANSETRON HCL 4 MG/2ML IJ SOLN
INTRAMUSCULAR | Status: DC | PRN
Start: 2021-09-14 — End: 2021-09-14
  Administered 2021-09-14: 4 mg via INTRAVENOUS

## 2021-09-14 MED ORDER — GADOBUTROL 1 MMOL/ML IV SOLN
8.0000 mL | Freq: Once | INTRAVENOUS | Status: AC | PRN
  Administered 2021-09-14: 8 mL via INTRAVENOUS

## 2021-09-14 MED ORDER — ONDANSETRON 4 MG PO TBDP: 4.0000 mg | ORAL_TABLET | Freq: Four times a day (QID) | ORAL | Status: DC | PRN

## 2021-09-14 MED ORDER — CEFAZOLIN SODIUM-DEXTROSE 2-3 GM-%(50ML) IV SOLR
INTRAVENOUS | Status: DC | PRN
Start: 2021-09-14 — End: 2021-09-14
  Administered 2021-09-14: 2 g via INTRAVENOUS

## 2021-09-14 MED ORDER — BUPIVACAINE-MELOXICAM ER 200-6 MG/7ML IJ SOLN
INTRAMUSCULAR | Status: DC | PRN
  Administered 2021-09-14: 7 mL

## 2021-09-14 MED ORDER — SUGAMMADEX SODIUM 200 MG/2ML IV SOLN
INTRAVENOUS | Status: DC | PRN
  Administered 2021-09-14: 160 mg via INTRAVENOUS

## 2021-09-14 MED ORDER — ROCURONIUM BROMIDE 100 MG/10ML IV SOLN
INTRAVENOUS | Status: DC | PRN
Start: 2021-09-14 — End: 2021-09-14
  Administered 2021-09-14: 50 mg via INTRAVENOUS

## 2021-09-14 MED ORDER — PHENYLEPHRINE HCL (PRESSORS) 10 MG/ML IV SOLN
INTRAVENOUS | Status: DC | PRN
  Administered 2021-09-14: 160 ug via INTRAVENOUS

## 2021-09-14 MED ORDER — BUPIVACAINE-EPINEPHRINE (PF) 0.25% -1:200000 IJ SOLN
INTRAMUSCULAR | Status: DC | PRN
  Administered 2021-09-14: 30 mL

## 2021-09-14 MED ORDER — 0.9 % SODIUM CHLORIDE (POUR BTL) OPTIME
TOPICAL | Status: DC | PRN
Start: 2021-09-14 — End: 2021-09-14
  Administered 2021-09-14: 500 mL

## 2021-09-14 MED ORDER — LIDOCAINE HCL (CARDIAC) PF 100 MG/5ML IV SOSY
PREFILLED_SYRINGE | INTRAVENOUS | Status: DC | PRN
  Administered 2021-09-14: 100 mg via INTRAVENOUS

## 2021-09-14 MED ORDER — KETOROLAC TROMETHAMINE 30 MG/ML IJ SOLN
15.0000 mg | Freq: Once | INTRAMUSCULAR | Status: AC
  Administered 2021-09-14: 15 mg via INTRAVENOUS
  Filled 2021-09-14: qty 1

## 2021-09-14 SURGICAL SUPPLY — 47 items
BAG RETRIEVAL 10 (BASKET) ×1
CLIP LIGATING HEM O LOK PURPLE (MISCELLANEOUS) ×2 IMPLANT
COVER TIP SHEARS 8 DVNC (MISCELLANEOUS) ×1 IMPLANT
COVER TIP SHEARS 8MM DA VINCI (MISCELLANEOUS) ×2
DERMABOND ADVANCED (GAUZE/BANDAGES/DRESSINGS) ×1
DERMABOND ADVANCED .7 DNX12 (GAUZE/BANDAGES/DRESSINGS) ×1 IMPLANT
DRAPE ARM DVNC X/XI (DISPOSABLE) ×4 IMPLANT
DRAPE COLUMN DVNC XI (DISPOSABLE) ×1 IMPLANT
DRAPE DA VINCI XI ARM (DISPOSABLE) ×4
DRAPE DA VINCI XI COLUMN (DISPOSABLE) ×1
ELECT CAUTERY BLADE 6.4 (BLADE) ×2 IMPLANT
GLOVE ORTHO TXT STRL SZ7.5 (GLOVE) ×4 IMPLANT
GOWN STRL REUS W/ TWL LRG LVL3 (GOWN DISPOSABLE) ×2 IMPLANT
GOWN STRL REUS W/ TWL XL LVL3 (GOWN DISPOSABLE) ×2 IMPLANT
GOWN STRL REUS W/TWL LRG LVL3 (GOWN DISPOSABLE) ×2
GOWN STRL REUS W/TWL XL LVL3 (GOWN DISPOSABLE) ×2
GRASPER SUT TROCAR 14GX15 (MISCELLANEOUS) IMPLANT
INFUSOR MANOMETER BAG 3000ML (MISCELLANEOUS) IMPLANT
IRRIGATION STRYKERFLOW (MISCELLANEOUS) IMPLANT
IRRIGATOR STRYKERFLOW (MISCELLANEOUS)
IRRIGATOR SUCT 8 DISP DVNC XI (IRRIGATION / IRRIGATOR) IMPLANT
IRRIGATOR SUCTION 8MM XI DISP (IRRIGATION / IRRIGATOR)
IV NS IRRIG 3000ML ARTHROMATIC (IV SOLUTION) IMPLANT
KIT PINK PAD W/HEAD ARE REST (MISCELLANEOUS) ×2 IMPLANT
KIT PINK PAD W/HEAD ARM REST (MISCELLANEOUS) ×1 IMPLANT
KIT TURNOVER KIT A (KITS) ×2 IMPLANT
LABEL OR SOLS (LABEL) ×2 IMPLANT
MANIFOLD NEPTUNE II (INSTRUMENTS) ×2 IMPLANT
NDL INSUFFLATION 14GA 120MM (NEEDLE) IMPLANT
NEEDLE HYPO 22GX1.5 SAFETY (NEEDLE) ×2 IMPLANT
NEEDLE INSUFFLATION 14GA 120MM (NEEDLE) ×2 IMPLANT
NS IRRIG 500ML POUR BTL (IV SOLUTION) ×2 IMPLANT
PACK LAP CHOLECYSTECTOMY (MISCELLANEOUS) ×2 IMPLANT
PENCIL ELECTRO HAND CTR (MISCELLANEOUS) ×2 IMPLANT
SEAL CANN UNIV 5-8 DVNC XI (MISCELLANEOUS) ×4 IMPLANT
SEAL XI 5MM-8MM UNIVERSAL (MISCELLANEOUS) ×4
SET TUBE SMOKE EVAC HIGH FLOW (TUBING) ×2 IMPLANT
SOLUTION ELECTROLUBE (MISCELLANEOUS) ×2 IMPLANT
SPIKE FLUID TRANSFER (MISCELLANEOUS) ×2 IMPLANT
SUT MNCRL 4-0 (SUTURE) ×1
SUT MNCRL 4-0 27XMFL (SUTURE) ×1
SUT VICRYL 0 AB UR-6 (SUTURE) ×2 IMPLANT
SUTURE MNCRL 4-0 27XMF (SUTURE) ×1 IMPLANT
SYS BAG RETRIEVAL 10MM (BASKET) ×1
SYSTEM BAG RETRIEVAL 10MM (BASKET) ×1 IMPLANT
TROCAR Z-THREAD FIOS 11X100 BL (TROCAR) IMPLANT
WATER STERILE IRR 500ML POUR (IV SOLUTION) ×2 IMPLANT

## 2021-09-14 NOTE — Discharge Instructions (Signed)
Reviewed

## 2021-09-14 NOTE — Progress Notes (Signed)
Patient arrived to floor with family at bedside.  Alert and oriented.  Awaiting surgery. ?

## 2021-09-14 NOTE — Progress Notes (Signed)
Discharge instructions reviewed, pt agrees, family present.  ?

## 2021-09-14 NOTE — Transfer of Care (Addendum)
Immediate Anesthesia Transfer of Care Note ? ?Patient: Nina Fowler ? ?Procedure(s) Performed: XI ROBOTIC ASSISTED LAPAROSCOPIC CHOLECYSTECTOMY (Abdomen) ? ?Patient Location: PACU ? ?Anesthesia Type:General ? ?Level of Consciousness: drowsy ? ?Airway & Oxygen Therapy: Patient Spontanous Breathing and Patient connected to face mask oxygen ? ?Post-op Assessment: Report given to RN and Post -op Vital signs reviewed and stable ? ?Post vital signs: Reviewed and stable ? ?Last Vitals:  ?Vitals Value Taken Time  ?BP    ?Temp    ?Pulse    ?Resp    ?SpO2    ? ? ?Last Pain:  ?Vitals:  ? 09/14/21 1627  ?TempSrc: Temporal  ?PainSc: 0-No pain  ?   ? ?  ? ?Complications: No notable events documented. ?

## 2021-09-14 NOTE — Anesthesia Preprocedure Evaluation (Signed)
Anesthesia Evaluation  ?Patient identified by MRN, date of birth, ID band ?Patient awake ? ? ? ?Reviewed: ?Allergy & Precautions, NPO status , Patient's Chart, lab work & pertinent test results ? ?History of Anesthesia Complications ?Negative for: history of anesthetic complications ? ?Airway ?Mallampati: II ? ?TM Distance: >3 FB ?Neck ROM: Full ? ? ? Dental ? ?(+) Chipped, Caps ?  ?Pulmonary ?neg pulmonary ROS, neg sleep apnea, neg COPD, Patient abstained from smoking.Not current smoker,  ?  ?Pulmonary exam normal ?breath sounds clear to auscultation ? ? ? ? ? ? Cardiovascular ?Exercise Tolerance: Good ?METS(-) hypertension(-) CAD and (-) Past MI negative cardio ROS ? ?(-) dysrhythmias  ?Rhythm:Regular Rate:Normal ?- Systolic murmurs ? ?  ?Neuro/Psych ? Headaches, PSYCHIATRIC DISORDERS Anxiety   ? GI/Hepatic ?neg GERD  ,(+)  ?  ? (-) substance abuse ? ,   ?Endo/Other  ?neg diabetes ? Renal/GU ?negative Renal ROS  ? ?  ?Musculoskeletal ? ? Abdominal ?  ?Peds ? Hematology ?  ?Anesthesia Other Findings ?Past Medical History: ?No date: Anxiety ?No date: Headache(784.0) ? Reproductive/Obstetrics ? ?  ? ? ? ? ? ? ? ? ? ? ? ? ? ?  ?  ? ? ? ? ? ? ? ? ?Anesthesia Physical ?Anesthesia Plan ? ?ASA: 2 ? ?Anesthesia Plan: General  ? ?Post-op Pain Management:   ? ?Induction: Intravenous and Rapid sequence ? ?PONV Risk Score and Plan: 4 or greater and Ondansetron, Dexamethasone and Midazolam ? ?Airway Management Planned: Oral ETT ? ?Additional Equipment: None ? ?Intra-op Plan:  ? ?Post-operative Plan: Extubation in OR ? ?Informed Consent: I have reviewed the patients History and Physical, chart, labs and discussed the procedure including the risks, benefits and alternatives for the proposed anesthesia with the patient or authorized representative who has indicated his/her understanding and acceptance.  ? ? ? ?Dental advisory given ? ?Plan Discussed with: CRNA and Surgeon ? ?Anesthesia Plan  Comments: (Discussed risks of anesthesia with patient, including PONV, sore throat, lip/dental/eye damage. Rare risks discussed as well, such as cardiorespiratory and neurological sequelae, and allergic reactions. Discussed the role of CRNA in patient's perioperative care. Patient understands. ? ?Patient has had vomiting earlier. Will plan on RSI)  ? ? ? ? ? ? ?Anesthesia Quick Evaluation ? ?

## 2021-09-14 NOTE — ED Provider Notes (Signed)
? ?[facility] ?Provider Note ? ? ? Event Date/Time  ? First MD Initiated Contact with Patient 09/14/21 0240   ?  (approximate) ? ? ?History  ? ?Abdominal Pain ? ? ?HPI ? ?Nina Fowler is a 51 y.o. female whose medical history is most notable for prior appendectomy.  She presents for evaluation of severe abdominal pain with nausea and vomiting.  She said that she has had intermittent abdominal pain for the last month but it started tonight at about 5:30 PM and has not gone away.  She had not just recently eaten.  Nothing in particular will make it better or worse and it is severe, occasionally rating through to the back, but also seems to move around in different places in her abdomen from lower to upper.  Currently it is mostly in her middle upper abdomen.  She denies fever, chest pain, shortness of breath.  She has had no dysuria recently.  She has never been told that she has issues with her gallbladder.  She had a little bit of wine tonight but does not regularly drink alcohol.  No new medications nor medication changes. ?  ? ? ?Physical Exam  ? ?Triage Vital Signs: ?ED Triage Vitals  ?Enc Vitals Group  ?   BP 09/14/21 0156 (!) 159/101  ?   Pulse Rate 09/14/21 0156 63  ?   Resp 09/14/21 0156 17  ?   Temp 09/14/21 0156 97.6 ?F (36.4 ?C)  ?   Temp Source 09/14/21 0156 Oral  ?   SpO2 09/14/21 0156 100 %  ?   Weight 09/14/21 0159 79.4 kg (175 lb)  ?   Height 09/14/21 0159 1.676 m (5\' 6" )  ?   Head Circumference --   ?   Peak Flow --   ?   Pain Score 09/14/21 0158 8  ?   Pain Loc --   ?   Pain Edu? --   ?   Excl. in GC? --   ? ? ?Most recent vital signs: ?Vitals:  ? 09/14/21 0156 09/14/21 0604  ?BP: (!) 159/101 (!) 177/106  ?Pulse: 63 (!) 58  ?Resp: 17 15  ?Temp: 97.6 ?F (36.4 ?C)   ?SpO2: 100% 100%  ? ? ? ?General: Awake, appears uncomfortable but not in severe distress. ?CV:  Good peripheral perfusion.  ?Resp:  Normal effort.  ?Abd:  Tender to palpation of the upper abdomen with equivocal  Murphy's sign.  No specific focus of localized tenderness to suggest peritonitis. ? ? ?ED Results / Procedures / Treatments  ? ?Labs ?(all labs ordered are listed, but only abnormal results are displayed) ?Labs Reviewed  ?COMPREHENSIVE METABOLIC PANEL - Abnormal; Notable for the following components:  ?    Result Value  ? Glucose, Bld 122 (*)   ? AST 155 (*)   ? ALT 62 (*)   ? Total Bilirubin 1.8 (*)   ? All other components within normal limits  ?CBC - Abnormal; Notable for the following components:  ? RBC 5.23 (*)   ? Hemoglobin 15.5 (*)   ? HCT 47.1 (*)   ? All other components within normal limits  ?LIPASE, BLOOD  ?URINALYSIS, ROUTINE W REFLEX MICROSCOPIC  ?POC URINE PREG, ED  ? ? ? ? ?RADIOLOGY ?I personally reviewed the patient's right upper quadrant ultrasound and MRCP.  See hospital course for specifics. ? ? ? ?PROCEDURES: ? ?Critical Care performed: No ? ?Procedures ? ? ?MEDICATIONS ORDERED IN ED: ?Medications  ?morphine (  PF) 4 MG/ML injection 4 mg (4 mg Intravenous Given 09/14/21 0727)  ?ketorolac (TORADOL) 30 MG/ML injection 15 mg (15 mg Intravenous Given 09/14/21 0316)  ?ondansetron [facility]) injection 4 mg (4 mg Intravenous Given 09/14/21 0316)  ?morphine (PF) 4 MG/ML injection 4 mg (4 mg Intravenous Given 09/14/21 0316)  ?lactated ringers bolus 1,000 mL (0 mLs Intravenous Stopping previously hung infusion 09/14/21 0717)  ?gadobutrol (GADAVIST) 1 MMOL/ML injection 8 mL (8 mLs Intravenous Contrast Given 09/14/21 0513)  ? ? ? ?IMPRESSION / MDM / ASSESSMENT AND PLAN / ED COURSE  ?I reviewed the triage vital signs and the nursing notes. ?             ?               ? ?Differential diagnosis includes, but is not limited to, biliary colic/gallbladder disease, gastritis, GERD, diverticulitis. ? ?Patient appears quite uncomfortable.  I ordered morphine 4 mg IV, Toradol 15 mg IV, and Zofran 4 mg IV, as well as 1 L LR IV bolus. ? ?Vital signs are notable for hypertension but otherwise unremarkable and reassuring.  Labs  ordered initially include CMP, CBC, and lipase.  She has not yet provided a urine specimen for urinalysis. ? ?I reviewed the results and her lipase is normal and her CBC is normal other than an elevated hemoglobin which suggest volume contraction.  However her CMP is notable for elevated AST, ALT, and total bilirubin.  This increases my suspicion of gallbladder disease. ? ?Plan of care is for right upper quadrant ultrasound as well as the medications as described above.  I talked with her and her husband who is at bedside and they understand and agree with the current plan. ? ? ? ? ?Clinical Course as of 09/14/21 0838  ?Fri Sep 14, 2021  ?0450 I personally reviewed the patient's ultrasound and I see evidence of gallstones but no cholecystitis.  The radiologist agreed with diagnosis of cholelithiasis without sonographic evidence of cholecystitis. ? ? [CF]  ?2229 I personally reviewed the patient's MRCP.  I cannot identify any specific acute abnormality.  I reviewed the radiology report.  They said that there is no evidence of obstruction, however they are concerned about subtle changes that could represent cholecystitis. ? ?I talked with the patient and while she is feeling better than before, she is still in pain and has tenderness to palpation particular of the epigastrium.  Given the MR changes in the persistent discomfort as well as the slight LFT elevation, I will consult Dr. Claudine Mouton to assess the patient for possible cholecystitis. [CF]  ?7989 I consulted Dr. Claudine Mouton by phone and discussed the case.  He agrees to consult on the patient in the emergency department to determine the appropriate disposition. [CF]  ?  ?Clinical Course User Index ?[CF] Loleta Rose, MD  ? ? ? ?FINAL CLINICAL IMPRESSION(S) / ED DIAGNOSES  ? ?Final diagnoses:  ?Cholecystitis  ? ? ? ?Rx / DC Orders  ? ?ED Discharge Orders   ? ? None  ? ?  ? ? ? ?Note:  This document was prepared using Dragon voice recognition software and may  include unintentional dictation errors. ?  ?Loleta Rose, MD ?09/14/21 [phone] ? ?

## 2021-09-14 NOTE — Progress Notes (Signed)
To: whom it may concern ?Re: Nina Fowler  DOB 1971-04-06 ? ?Today 09/14/2021 this person is undergoing emergency surgery.  This cannot be deferred, and must be performed today.   ?I am aware her family was planning a trip, and unfortunately her need for surgery will prevent them from going.  ? ?Please extend all considerations toward this family and the unplanned medical emergency.   ? ? ?Campbell Lerner, M.D., FACS ?[facility] ? ?09/14/2021 ; 1:18 PM ? ?

## 2021-09-14 NOTE — Op Note (Signed)
Robotic cholecystectomy with Indocyamine Green Ductal Imaging.  ? ?Pre-operative Diagnosis: Acute calculus cholecystitis ? ?Post-operative Diagnosis:  Same. ? ?Procedure: Robotic assisted laparoscopic cholecystectomy with Indocyamine Green Ductal Imaging.  ? ?Surgeon: Ronny Bacon, M.D., FACS ? ?Anesthesia: General. with endotracheal tube ? ?Findings: Markedly edematous gallbladder.  Multiple 5 mm mulberry stones ? ?Estimated Blood Loss: 15 mL ?        ?Drains: None ?        ?Specimens: Gallbladder     ?      ?Complications: none ? ?Procedure Details  ?The patient was seen again in the Holding Room.  2.5 mg dose of ICG was administered intravenously.   ?The benefits, complications, treatment options, risks and expected outcomes were again reviewed with the patient. The likelihood of improving the patient's symptoms with return to their baseline status is good.  The patient and/or family concurred with the proposed plan, giving informed consent, again alternatives reviewed.  The patient was taken to Operating Room, identified, and the procedure verified as robotic assisted laparoscopic cholecystectomy. ? ?Prior to the induction of general anesthesia, antibiotic prophylaxis was administered. VTE prophylaxis was in place. General endotracheal anesthesia was then administered and tolerated well. The patient was positioned in the supine position.  After the induction, the abdomen was prepped with Chloraprep and draped in the sterile fashion.  ?A Time Out was held and the above information confirmed. ? ?Right infra-umbilical local infiltration with quarter percent Marcaine with epinephrine is utilized.  Made a 12 mm incision on the right periumbilical site, I advanced an optical 31mm port under direct visualization into the peritoneal cavity.  Once the peritoneum was penetrated, insufflation was initiated.  The trocar was then advanced into the abdominal cavity under direct visualization. ?Pneumoperitoneum was then  continued utilizing CO2 at 15 mmHg or less and tolerated well without any adverse changes in the patient's vital signs.  Two 8.5-mm ports were placed in the left lower quadrant and laterally, and one to the right lower quadrant, all under direct vision. All skin incisions  were infiltrated with a local anesthetic agent before making the incision and placing the trocars.  ?The patient was positioned  in reverse Trendelenburg, tilted the patient's left side down.  Da Vinci XI robot was then positioned on to the patient's left side, and docked. ? ?The gallbladder was identified, the fundus grasped via the arm 4 Prograsp and retracted cephalad. Adhesions were lysed with scissors and cautery.  The infundibulum was identified grasped and retracted laterally, exposing the peritoneum overlying the triangle of Calot. This was then opened and dissected using cautery & scissors. An extended critical view of the cystic duct and cystic artery was obtained, aided by the ICG via FireFly which enabled ready visualization of the ductal anatomy.   ? ? ? ? ? ? ?The cystic duct was clearly identified and dissected to isolation.   Artery well isolated and clipped, and the cystic duct was triple clipped and divided with scissors, as close to the gallbladder neck as feasible, thus leaving two on the remaining stump.  The specimen side of the artery is sealed with bipolar and divided with monopolar scissors.  ? ?The gallbladder was taken from the gallbladder fossa in a retrograde fashion with the electrocautery. The gallbladder was removed and placed in an Endocatch bag.  ?The liver bed is inspected. Hemostasis was confirmed.  ?The robot was undocked and moved away from the operative field. ?No irrigation was utilized. The gallbladder and Endocatch sac were  then removed through the infraumbilical port site.  ? ? ? ? ? ? ? ?Inspection of the right upper quadrant was performed. No bleeding, bile duct injury or leak, or bowel injury was  noted. The infra-umbilical port site fascia was closed with interrumpted 0 Vicryl sutures using PMI/cone under direct visualization. Pneumoperitoneum was released and ports removed.  4-0 subcuticular Monocryl was used to close the skin. Dermabond was  applied.  The patient was then extubated and brought to the recovery room in stable condition. Sponge, lap, and needle counts were correct at closure and at the conclusion of the case.  ?        ?     ?Ronny Bacon, M.D., FACS ?09/14/2021 7:11 PM ? ?

## 2021-09-14 NOTE — Progress Notes (Signed)
IV d/c'd, pt up in room, voided, tolerating liquids, assisted to car via wheelchair. ?

## 2021-09-14 NOTE — ED Notes (Signed)
Report to Clydie Braun in [place] ?

## 2021-09-14 NOTE — H&P (Signed)
[facility] ?SURGICAL HISTORY & PHYSICAL (cpt [id number]) ? ?HISTORY OF PRESENT ILLNESS (HPI):  ?51 y.o. female presented to [facility] ED overnight for abdominal pain. Patient reports that she has had about 3-4 "attacks" of epigastric and RUQ pain over the last week. Last night, around 1700, she had a severe episode of RUQ pain radiating to her back. This unfortunately did not resolve. This was accompanied by nausea and emesis as well. No fever, chills, CP, SOB, urinary changes, bowel changes, or jaundice. Prior to these episodes, she has not been aware of any gallbladder disease. Previous intra-abdominal surgeries include open appendectomy and oophorectomy. Work up in the ED revealed a normal WBC to 6.9K, renal function normal with sCr - 0.79, lipase normal at 51, she does have hyperbilirubinemia to 1.8. RUQ [place] was  obtained and showed cholelithiasis without definitive evidence of cholecystitis. MRCP was obtained given hyperbilirubinemia which again showed cholelithiasis without overt evidence of cholecystitis and negative for choledocholithiasis.  ? ?General surgery is consulted by emergency medicine physician Dr Hinda Kehr, MD for evaluation and management of cholelithiasis and possible cholecystitis.  ? ? ?PAST MEDICAL HISTORY (PMH):  ?Past Medical History:  ?Diagnosis Date  ? Anxiety   ? Headache(784.0)   ?  ?Reviewed. Otherwise negative.  ? ?PAST SURGICAL HISTORY ([place]):  ?Past Surgical History:  ?Procedure Laterality Date  ? APPENDECTOMY    ? OVARIAN CYST REMOVAL    ?  ?Reviewed. Otherwise negative.  ? ?MEDICATIONS:  ?Prior to Admission medications   ?Medication Sig Start Date End Date Taking? Authorizing Provider  ?acetaminophen (TYLENOL) 500 MG tablet Take 1,000 mg by mouth every 6 (six) hours as needed for headache.    [provider]  ?flintstones complete (FLINTSTONES) 60 MG chewable tablet Chew 2 tablets by mouth daily.    [provider]  ?ibuprofen (ADVIL,MOTRIN) 600 MG  tablet Take 1 tablet (600 mg total) by mouth every 6 (six) hours. 09/14/13   Laury Deep, CNM  ?sertraline (ZOLOFT) 25 MG tablet Take 1 tablet (25 mg total) by mouth daily. 09/14/13   Laury Deep, CNM  ?  ? ?ALLERGIES:  ?Allergies  ?Allergen Reactions  ? Dilaudid [Hydromorphone] Nausea And Vomiting  ? Promethazine Nausea And Vomiting  ?  ? ?SOCIAL HISTORY:  ?Social History  ? ?Socioeconomic History  ? Marital status: Married  ?  Spouse name: Not on file  ? Number of children: Not on file  ? Years of education: Not on file  ? Highest education level: Not on file  ?Occupational History  ? Not on file  ?Tobacco Use  ? Smoking status: Never  ? Smokeless tobacco: Never  ?Substance and Sexual Activity  ? Alcohol use: No  ? Drug use: No  ? Sexual activity: Yes  ?Other Topics Concern  ? Not on file  ?Social History Narrative  ? Not on file  ? ?Social Determinants of Health  ? ?Financial Resource Strain: Not on file  ?Food Insecurity: Not on file  ?Transportation Needs: Not on file  ?Physical Activity: Not on file  ?Stress: Not on file  ?Social Connections: Not on file  ?Intimate Partner Violence: Not on file  ?  ? ?FAMILY HISTORY:  ?Family History  ?Problem Relation Age of Onset  ? Hypertension Mother   ? Hypertension Father   ? Stroke Father   ? Cancer Brother   ?     synovial sarcoma  ? Lupus Brother   ? Cancer Maternal Grandfather   ?  liver  ?  ?Otherwise negative.  ? ?REVIEW OF SYSTEMS:  ?Review of Systems  ?Constitutional:  Negative for chills and fever.  ?Respiratory:  Negative for cough and shortness of breath.   ?Cardiovascular:  Negative for chest pain and palpitations.  ?Gastrointestinal:  Positive for abdominal pain, nausea and vomiting. Negative for constipation and diarrhea.  ?Genitourinary:  Negative for dysuria and urgency.  ?Neurological:  Negative for dizziness and headaches.  ?All other systems reviewed and are negative. ? ?VITAL SIGNS:  ?Temp:  [97.6 ?F (36.4 ?C)] 97.6 ?F (36.4 ?C) (04/28  0156) ?Pulse Rate:  [58-63] 58 (04/28 0604) ?Resp:  [15-17] 15 (04/28 0604) ?BP: (159-177)/(101-106) 177/106 (04/28 0604) ?SpO2:  [100 %] 100 % (04/28 0604) ?Weight:  [79.4 kg] 79.4 kg (04/28 0159)     Height: 5' 6"  (167.6 cm) Weight: 79.4 kg BMI (Calculated): 28.26  ? ?PHYSICAL EXAM:  ?Physical Exam ?Vitals and nursing note reviewed. Exam conducted with a chaperone present.  ?Constitutional:   ?   General: She is not in acute distress. ?   Appearance: She is well-developed. She is obese. She is not ill-appearing.  ?HENT:  ?   Head: Normocephalic and atraumatic.  ?Eyes:  ?   General: No scleral icterus. ?   Extraocular Movements: Extraocular movements intact.  ?Cardiovascular:  ?   Rate and Rhythm: Normal rate and regular rhythm.  ?   Heart sounds: Normal heart sounds. No murmur heard. ?Pulmonary:  ?   Effort: Pulmonary effort is normal.  ?   Breath sounds: Normal breath sounds. No wheezing.  ?Abdominal:  ?   General: A surgical scar is present. There is no distension.  ?   Palpations: Abdomen is soft.  ?   Tenderness: There is no abdominal tenderness. There is no guarding or rebound. Negative signs include Murphy's sign.  ?   Comments: Abdomen is soft, she does not appear overtly tender at time of my exam but she attributes this to morphine, non-distended, no rebound/guarding   ?Genitourinary: ?   Comments: Deferred ?Skin: ?   General: Skin is warm and dry.  ?   Coloration: Skin is not jaundiced or pale.  ?Neurological:  ?   General: No focal deficit present.  ?   Mental Status: She is alert and oriented to person, place, and time.  ?Psychiatric:     ?   Mood and Affect: Mood normal.     ?   Behavior: Behavior normal.  ? ? ?INTAKE/OUTPUT:  ?This shift: No intake/output data recorded.  ?Last 2 shifts: @IOLAST2SHIFTS @ ? ?Labs:  ? ?  Latest Ref Rng & Units 09/14/2021  ?  2:13 AM 09/13/2013  ?  6:00 AM 09/11/2013  ?  8:05 PM  ?CBC  ?WBC 4.0 - 10.5 K/uL 6.9   10.9   10.2    ?Hemoglobin 12.0 - 15.0 g/dL 15.5   12.4   13.0     ?Hematocrit 36.0 - 46.0 % 47.1   36.7   37.7    ?Platelets 150 - 400 K/uL 237   133   149    ? ? ?  Latest Ref Rng & Units 09/14/2021  ?  2:13 AM  ?CMP  ?Glucose 70 - 99 mg/dL 122    ?BUN 6 - 20 mg/dL 10    ?Creatinine 0.44 - 1.00 mg/dL 0.79    ?Sodium 135 - 145 mmol/L 139    ?Potassium 3.5 - 5.1 mmol/L 4.3    ?Chloride 98 - 111 mmol/L 103    ?  CO2 22 - 32 mmol/L 26    ?Calcium 8.9 - 10.3 mg/dL 9.4    ?Total Protein 6.5 - 8.1 g/dL 7.8    ?Total Bilirubin 0.3 - 1.2 mg/dL 1.8    ?Alkaline Phos 38 - 126 U/L 102    ?AST 15 - 41 U/L 155    ?ALT 0 - 44 U/L 62    ? ? ? ?Imaging studies:  ? ?RUQ [place] (09/14/2021) personally reviewed showing cholelithiasis, no overt evidence of cholecystitis, and radiologist report reviewed below:  ?IMPRESSION: ?Cholelithiasis, without associated sonographic findings to suggest ?acute cholecystitis. ?  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 ? ? ?MRCP (09/14/2021) personally reviewed without choledocholithiasis, and radiologist report reviewed below:  ?IMPRESSION: ?1. Cholelithiasis with subtle pericholecystic fluid. Acute ?cholecystitis could have this appearance. If there is clinical ?concern for cystic duct obstruction, nuclear scintigraphy could be ?used to further evaluate. ?2. No choledocholithiasis. No intrahepatic or extrahepatic biliary ?dilation. ? ? ?Assessment/Plan: (ICD-10's: K80.20) ?51 y.o. female with multiple episodes of symptomatic cholelithiasis in the last week without overt evidence of cholecystitis, possible early, and no evidence of choledocholithiasis on MRCP.  ? ? - Given multiple episodes of likely gallbladder "attacks" in the last week and known cholelithiasis, I do think it would be prudent to pursue cholecystectomy. She does report she is supposed to go on a cruise tomorrow, and I briefly discussed the possibility of conservative management and outpatient follow up, but she is worried about continuing to have issues and  elects for surgery, which I agree is most reasonable choice. Will plan on robotic assisted laparoscopic cholecystectomy today with Dr Christian Mate pending OR/anesthesia availability  ? - All risks, benefits, and alter

## 2021-09-14 NOTE — ED Triage Notes (Signed)
Pt presents to ER from home c/o intermittent abd pain that has been going on for 1.5 months.  Pt states this bout of abd started around 1730 yesterday and has been steady since then.  Pt states pain is generalized and moves all around her abdomen.  Pt endorses some n/v but denies diarrhea.  Pt states she has had prior appendectomy, but no other abd surgeries noted.  Pt is A&O x4 at this time in NAD.   ?

## 2021-09-14 NOTE — Anesthesia Postprocedure Evaluation (Signed)
Anesthesia Post Note ? ?Patient: Nina Fowler ? ?Procedure(s) Performed: XI ROBOTIC ASSISTED LAPAROSCOPIC CHOLECYSTECTOMY (Abdomen) ? ?Patient location during evaluation: PACU ?Anesthesia Type: General ?Level of consciousness: awake and alert ?Pain management: pain level controlled ?Vital Signs Assessment: post-procedure vital signs reviewed and stable ?Respiratory status: spontaneous breathing, nonlabored ventilation, respiratory function stable and patient connected to nasal cannula oxygen ?Cardiovascular status: blood pressure returned to baseline and stable ?Postop Assessment: no apparent nausea or vomiting ?Anesthetic complications: no ? ? ?No notable events documented. ? ? ?Last Vitals:  ?Vitals:  ? 09/14/21 1950 09/14/21 1958  ?BP: (!) 160/94 (!) 157/93  ?Pulse: 64 66  ?Resp: 14 16  ?Temp:  36.8 ?C  ?SpO2: 95% 98%  ?  ?Last Pain:  ?Vitals:  ? 09/14/21 1958  ?TempSrc: Oral  ?PainSc: 1   ? ? ?  ?  ?  ?  ?  ?  ? ?Corinda Gubler ? ? ? ? ?

## 2021-09-14 NOTE — ED Notes (Signed)
Pt resting comfortably at this time. Pt does report some increased pain, PRN medications to be given. Pt denies any other needs at this time. NAD noted. Call bell in reach.   ?

## 2021-09-14 NOTE — ED Notes (Signed)
Pt taken to MRI at this time

## 2021-09-15 NOTE — Discharge Summary (Signed)
Physician Discharge Summary  ?Patient ID: ?Nina Fowler ?MRN: [id number] ?DOB/AGE: 1971-04-26 51 y.o. ? ?Admit date: 09/14/2021 ?Discharge date: 09/14/2021 ? ?Admission Diagnoses: Acute calculus cholecystitis ? ?Discharge Diagnoses:  ?Principal Problem: ?  Acute cholecystitis ? ? ?Discharged Condition: good ? ?Hospital Course: Admitted in the morning, procedure in late afternoon, early evening.  Tolerated diet and had adequate pain control and was discharged home same day. ? ?Consults: None ? ?Significant Diagnostic Studies: radiology: MRI: See report and Ultrasound: See report ? ?Treatments: surgery: Robotic cholecystectomy. ? ?Discharge Exam: ?Blood pressure (!) 157/93, pulse 66, temperature 98.3 ?F (36.8 ?C), temperature source Oral, resp. rate 16, height 5\' 6"  (1.676 m), weight 79.4 kg, SpO2 98 %, unknown if currently breastfeeding. ?Incision/Wound: Incisions clean dry and intact on discharge. ? ?Disposition: Discharge disposition: 01-Home or Self Care ? ? ? ? ? ? ?Discharge Instructions   ? ? Call MD for:  persistant nausea and vomiting   Complete by: As directed ?  ? Call MD for:  persistant nausea and vomiting   Complete by: As directed ?  ? Call MD for:  redness, tenderness, or signs of infection (pain, swelling, redness, odor or green/yellow discharge around incision site)   Complete by: As directed ?  ? Call MD for:  redness, tenderness, or signs of infection (pain, swelling, redness, odor or green/yellow discharge around incision site)   Complete by: As directed ?  ? Call MD for:  severe uncontrolled pain   Complete by: As directed ?  ? Call MD for:  severe uncontrolled pain   Complete by: As directed ?  ? Diet - low sodium heart healthy   Complete by: As directed ?  ? Discharge wound care:   Complete by: As directed ?  ? Your incision was closed with Dermabond.  It is best to keep it clean and dry, it will tolerate a brief shower, but do not soak it or apply any creams or lotions to the incisions.  The  Dermabond should gradually flake off over time.  Keep it open to air so you can evaluate your incisions.  Dermabond assists the underlying sutures to keep your incision closed and protected from infection.  Should you develop some drainage from your incision, some drops of drainage would be okay but if it persists continue to put keep a dry dressing over it.  ? Discharge wound care:   Complete by: As directed ?  ? Your incision was closed with Dermabond.  It is best to keep it clean and dry, it will tolerate a brief shower, but do not soak it or apply any creams or lotions to the incisions.  The Dermabond should gradually flake off over time.  Keep it open to air so you can evaluate your incisions.  Dermabond assists the underlying sutures to keep your incision closed and protected from infection.  Should you develop some drainage from your incision, some drops of drainage would be okay but if it persists continue to put keep a dry dressing over it.  ? Driving Restrictions   Complete by: As directed ?  ? No driving until cleared after follow-up appointment.  Is not advised to drive while taking narcotic pain medications or in significant pain.  ? Driving Restrictions   Complete by: As directed ?  ? No driving until cleared after follow-up appointment.  Is not advised to drive while taking narcotic pain medications or in significant pain.  ? Increase activity slowly   Complete by:  As directed ?  ? Increase activity slowly   Complete by: As directed ?  ? Lifting restrictions   Complete by: As directed ?  ? Strongly advised against any form of lifting greater than 15 pounds over the next 4 to 6 weeks.  This involves pushing/pulling movements as well.  After 4 weeks when may gradually engage in more activities remaining aware of any new pain/tenderness elicited, and avoiding those for the full duration of 6 weeks.  Walking is encouraged.  Climbing stairs with caution.  ? Lifting restrictions   Complete by: As directed ?   ? Strongly advised against any form of lifting greater than 15 pounds over the next 4 to 6 weeks.  This involves pushing/pulling movements as well.  After 4 weeks when may gradually engage in more activities remaining aware of any new pain/tenderness elicited, and avoiding those for the full duration of 6 weeks.  Walking is encouraged.  Climbing stairs with caution.  ? ?  ? ?Allergies as of 09/14/2021   ? ?   Reactions  ? Radish [raphanus Sativus] Anaphylaxis  ? Horse radish  ? Dilaudid [hydromorphone] Nausea And Vomiting  ? Oxycodone Other (See Comments)  ? Makes her feel funny  ? Promethazine Nausea And Vomiting  ? ?  ? ?  ?Medication List  ?  ? ?TAKE these medications   ? ?acetaminophen 500 MG tablet ?Commonly known as: TYLENOL ?Take 1,000 mg by mouth every 6 (six) hours as needed for headache. ?  ?butalbital-acetaminophen-caffeine 50-325-40 MG tablet ?Commonly known as: FIORICET ?Take by mouth. ?  ?flintstones complete 60 MG chewable tablet ?Chew 2 tablets by mouth daily. ?  ?ibuprofen 600 MG tablet ?Commonly known as: ADVIL ?Take 1 tablet (600 mg total) by mouth every 6 (six) hours. ?  ?propranolol ER 60 MG 24 hr capsule ?Commonly known as: INDERAL LA ?Take 60 mg by mouth daily. ?  ?sertraline 25 MG tablet ?Commonly known as: ZOLOFT ?Take 1 tablet (25 mg total) by mouth daily. ?  ?Tri-Estarylla 0.18/0.215/0.25 MG-35 MCG tablet ?Generic drug: Norgestimate-Ethinyl Estradiol Triphasic ?Take 1 tablet by mouth daily. ?  ?venlafaxine XR 75 MG 24 hr capsule ?Commonly known as: EFFEXOR-XR ?Take 150 mg by mouth daily. ?  ? ?  ? ?  ?  ? ? ?  ?Discharge Care Instructions  ?(From admission, onward)  ?  ? ? ?  ? ?  Start     Ordered  ? 09/14/21 0000  Discharge wound care:       ?Comments: Your incision was closed with Dermabond.  It is best to keep it clean and dry, it will tolerate a brief shower, but do not soak it or apply any creams or lotions to the incisions.  The Dermabond should gradually flake off over time.  Keep  it open to air so you can evaluate your incisions.  Dermabond assists the underlying sutures to keep your incision closed and protected from infection.  Should you develop some drainage from your incision, some drops of drainage would be okay but if it persists continue to put keep a dry dressing over it.  ? 09/14/21 1912  ? 09/14/21 0000  Discharge wound care:       ?Comments: Your incision was closed with Dermabond.  It is best to keep it clean and dry, it will tolerate a brief shower, but do not soak it or apply any creams or lotions to the incisions.  The Dermabond should gradually flake off over time.  Keep it open to air so you can evaluate your incisions.  Dermabond assists the underlying sutures to keep your incision closed and protected from infection.  Should you develop some drainage from your incision, some drops of drainage would be okay but if it persists continue to put keep a dry dressing over it.  ? 09/14/21 2220  ? ?  ?  ? ?  ? ? Follow-up Information   ? ? Donovan KailSchulz, Zachary R, PA-C. Schedule an appointment as soon as possible for a visit in 2 week(s).   ?Specialty: Physician Assistant ?Contact information: ?[address] ?[address] ?[place] [address] ?[phone] ? ? ?  ?  ? ?  ?  ? ?  ? ? ?Signed: ?Campbell Lernerenny Kyah Buesing, M.D., FACS ?[facility] ?09/15/2021, 5:08 PM ? ? ?

## 2021-09-18 LAB — SURGICAL PATHOLOGY

## 2021-09-27 ENCOUNTER — Other Ambulatory Visit: Payer: Self-pay

## 2021-09-27 ENCOUNTER — Ambulatory Visit (INDEPENDENT_AMBULATORY_CARE_PROVIDER_SITE_OTHER): Payer: No Typology Code available for payment source | Admitting: Physician Assistant

## 2021-09-27 ENCOUNTER — Encounter: Payer: Self-pay | Admitting: Physician Assistant

## 2021-09-27 VITALS — BP 147/82 | HR 68 | Temp 98.5°F | Ht 66.0 in | Wt 177.4 lb

## 2021-09-27 DIAGNOSIS — K8000 Calculus of gallbladder with acute cholecystitis without obstruction: Secondary | ICD-10-CM

## 2021-09-27 DIAGNOSIS — Z09 Encounter for follow-up examination after completed treatment for conditions other than malignant neoplasm: Secondary | ICD-10-CM

## 2021-09-27 DIAGNOSIS — K810 Acute cholecystitis: Secondary | ICD-10-CM

## 2021-09-27 NOTE — Patient Instructions (Signed)

## 2021-09-27 NOTE — Progress Notes (Signed)
[facility] ?POST-OP OFFICE VISIT ? ?09/27/2021 ? ?HPI: ?Nina Fowler is a 51 y.o. female 13 days s/p robotic assisted laparoscopic cholecystectomy for acute cholecystitis with Dr Christian Mate ? ?She is doing well ?Biggest issue is diarrhea but she is avoiding fatty foods, taking imodium but has only needed this twice since surgery ?No fever, chills, nausea, emesis ?No issues with incisions ?No other complaints  ? ?Vital signs: ?BP (!) 147/82   Pulse 68   Temp 98.5 ?F (36.9 ?C) (Other (Comment))   Ht 5\' 6"  (1.676 m)   Wt 177 lb 6.4 oz (80.5 kg)   LMP  (LMP Unknown)   SpO2 99%   BMI 28.63 kg/m?   ? ?Physical Exam: ?Constitutional: Well appearing female, NAD ?Abdomen: Soft, non-tender, non-distended, no rebound/guarding ?Skin: Laparoscopic incisions are healing well, no erythema or drainage  ? ?Assessment/Plan: ?This is a 51 y.o. female 13 days s/p robotic assisted laparoscopic cholecystectomy for acute cholecystitis ? ? - Pain control prn ? - Imodium as needed ? - Reviewed wound care recommendation ? - Reviewed lifting restrictions; 4 weeks total ? - Reviewed surgical pathology; acute on chronic cholecystitis with cholelithiasis  ? - She can follow up on as needed basis; She understands to call with questions/concerns ? ?-- ?Edison Simon, PA-C ?[facility] ?09/27/2021, 2:43 PM ?M-F: 7am - 4pm ? ?

## 2021-10-01 ENCOUNTER — Other Ambulatory Visit: Payer: Self-pay | Admitting: Internal Medicine

## 2021-10-01 DIAGNOSIS — Z1231 Encounter for screening mammogram for malignant neoplasm of breast: Secondary | ICD-10-CM

## 2021-10-17 ENCOUNTER — Ambulatory Visit
Admission: RE | Admit: 2021-10-17 | Discharge: 2021-10-17 | Disposition: A | Discharge status: Home / Self Care | Payer: Self-pay | Source: Ambulatory Visit | Attending: Internal Medicine | Admitting: Internal Medicine

## 2021-10-17 DIAGNOSIS — Z1231 Encounter for screening mammogram for malignant neoplasm of breast: Secondary | ICD-10-CM | POA: Insufficient documentation

## 2022-11-13 ENCOUNTER — Other Ambulatory Visit: Payer: Self-pay

## 2022-11-13 DIAGNOSIS — Z1231 Encounter for screening mammogram for malignant neoplasm of breast: Secondary | ICD-10-CM

## 2022-11-18 ENCOUNTER — Ambulatory Visit: Payer: Self-pay

## 2023-05-07 IMAGING — MR MR ABDOMEN WO/W CM MRCP
19 of 21 series · 45 of 48 positions shown · IV contrast (gadavist)
Comparison: Ultrasound exam earlier same day

CLINICAL DATA: Cholelithiasis with echogenic liver lesion on
ultrasound.

EXAM:
MRI ABDOMEN WITHOUT AND WITH CONTRAST (INCLUDING MRCP)
TECHNIQUE: Multiplanar multisequence MR imaging of the abdomen was performed
both before and after the administration of intravenous contrast.
Heavily T2-weighted images of the biliary and pancreatic ducts were
obtained, and three-dimensional MRCP images were rendered by post
processing.
CONTRAST:  8mL GADAVIST GADOBUTROL 1 MMOL/ML IV SOLN

[Series 3: T2 · coronal · 6.0mm · 1.19mm/px · 1 of 30 slices shown (1 of 2)]
[im 1/30]
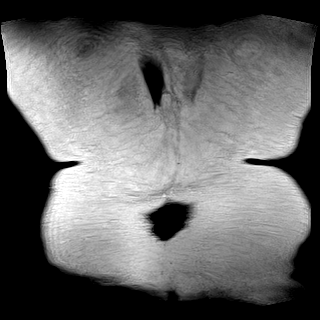

[Series 4: T2 · axial · 6.0mm · 1.19mm/px · 1 of 34 slices shown (2 of 2)]
[im 1/34]
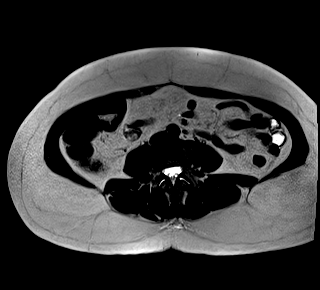

[Series 5: in & out · axial · 3.0mm · 1.19mm/px · z∈[-143,+94]mm · 2 of 80 slices shown (1 of 2)]
[im 1/80]
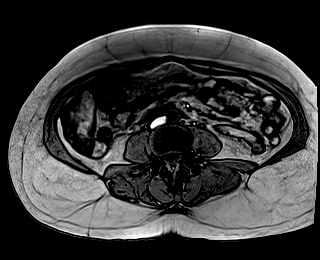
[im 80/80]
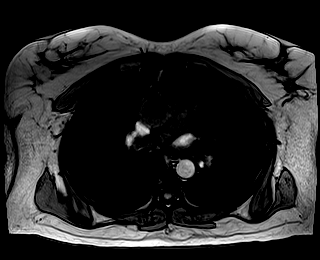

[Series 6: in & out · axial · 3.0mm · 1.19mm/px · z∈[-143,+94]mm · 3 of 80 slices shown (2 of 2)]
[im 1/80]
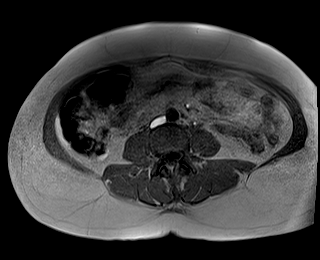
[im 40/80]
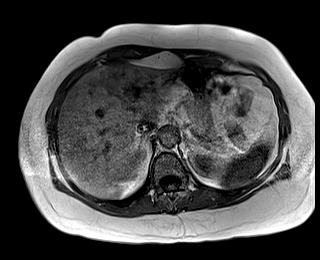
[im 80/80]
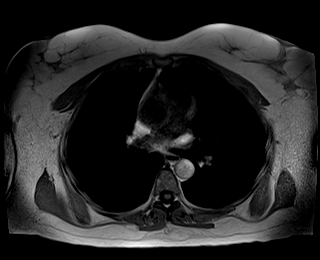

[Series 9: T2 fat-sat · axial · 6.0mm · 1.19mm/px · 1 of 33 slices shown]
[im 1/33]
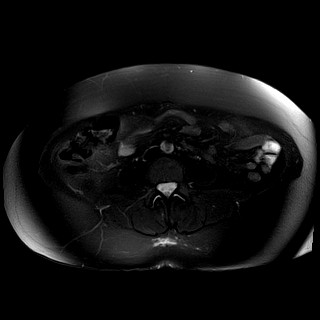

[Series 10: ax dwi_tracew · axial · 6.0mm · 1.42mm/px · z∈[-151,+87]mm · 4 of 102 slices shown]
[im 1/102]
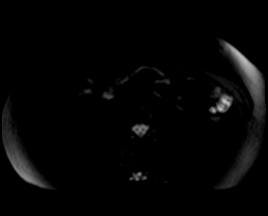
[im 34/102]
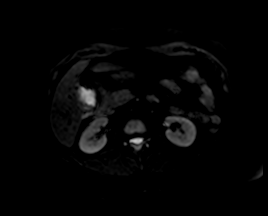
[im 68/102]
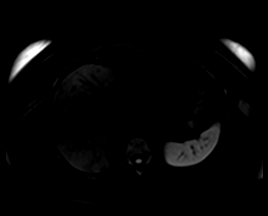
[im 102/102]
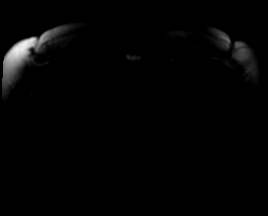

[Series 11: ax dwi_adc · axial · 6.0mm · 1.42mm/px · 1 of 34 slices shown]
[im 1/34]
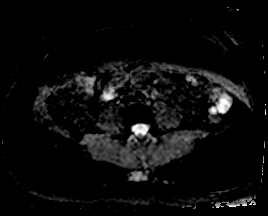

[Series 15: MRCP · coronal · 3.0mm · 1.12mm/px · 1 of 17 slices shown]
[im 1/17]
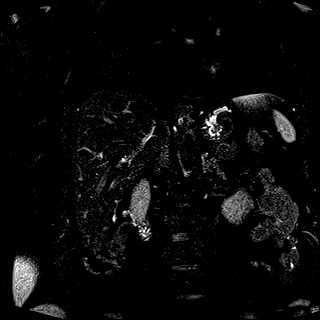

[Series 16: radials · coronal · 50.0mm · 0.78mm/px · 1 of 5 slices shown]
[im 1/5]
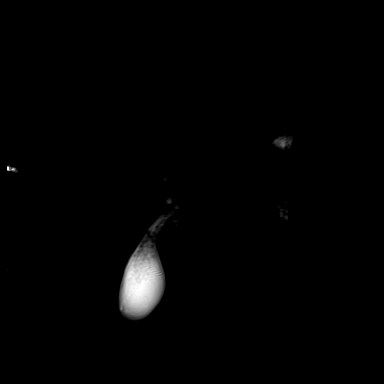

[Series 17: T1 dynamic fat-sat · axial · non-contrast · 3.0mm · 1.19mm/px · z∈[-143,+94]mm · 3 of 80 slices shown (1 of 5)]
[im 1/80]
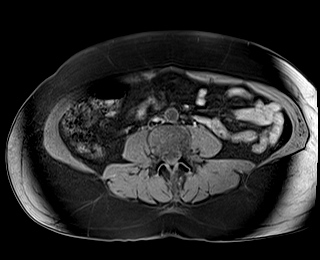
[im 40/80]
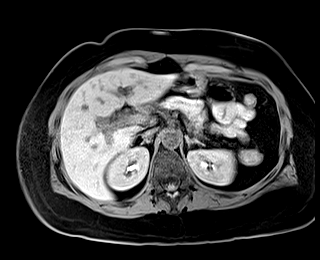
[im 80/80]
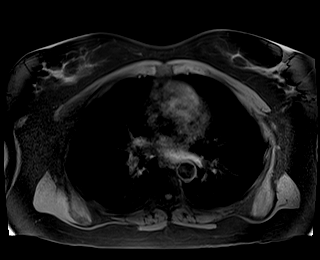

[Series 18: T1 dynamic fat-sat post-contrast · axial · 3.0mm · 1.19mm/px · z∈[-143,+94]mm · 3 of 80 slices shown (1 of 4)]
[im 1/80]
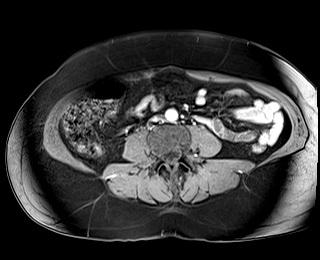
[im 40/80]
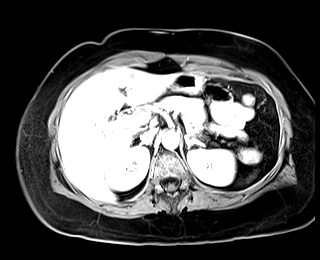
[im 80/80]
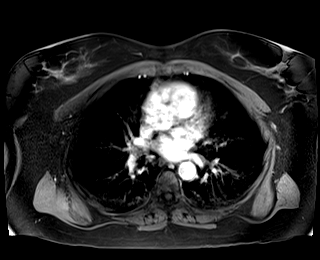

[Series 19: T1 dynamic fat-sat · axial · 3.0mm · 1.19mm/px · z∈[-143,+94]mm · 3 of 80 slices shown (2 of 5)]
[im 1/80]
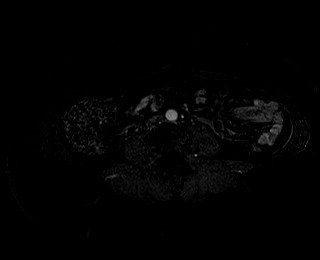
[im 40/80]
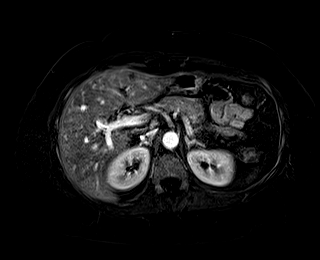
[im 80/80]
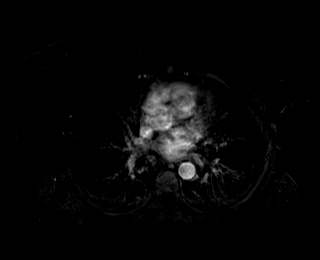

[Series 20: T1 dynamic fat-sat post-contrast · axial · 3.0mm · 1.19mm/px · z∈[-143,+94]mm · 3 of 80 slices shown (2 of 4)]
[im 1/80]
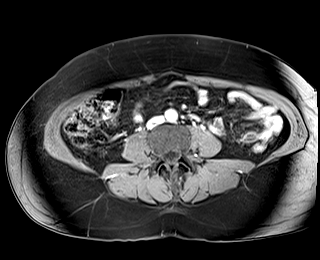
[im 40/80]
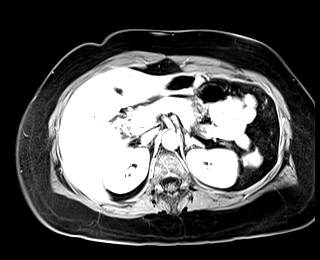
[im 80/80]
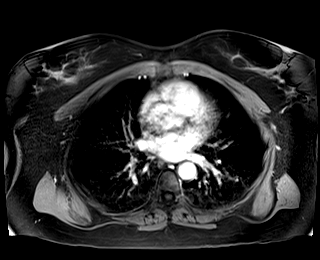

[Series 21: T1 dynamic fat-sat · axial · 3.0mm · 1.19mm/px · z∈[-143,+94]mm · 3 of 80 slices shown (3 of 5)]
[im 1/80]
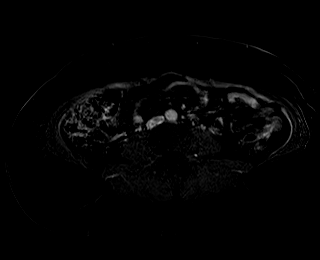
[im 40/80]
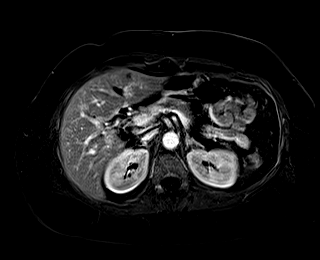
[im 80/80]
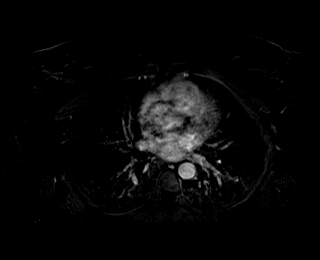

[Series 22: T1 dynamic fat-sat post-contrast · axial · 3.0mm · 1.19mm/px · z∈[-143,+94]mm · 3 of 80 slices shown (3 of 4)]
[im 1/80]
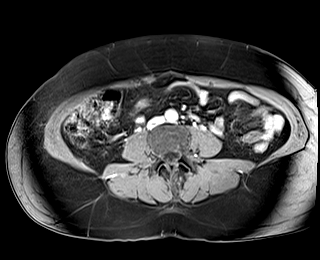
[im 40/80]
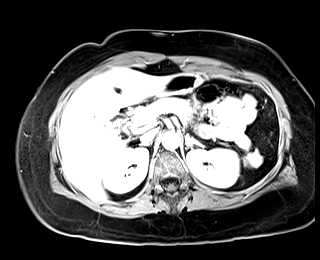
[im 80/80]
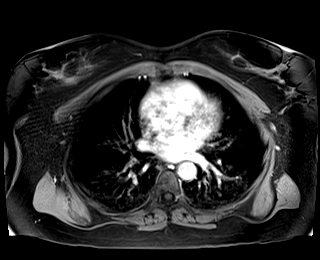

[Series 23: T1 dynamic fat-sat · axial · 3.0mm · 1.19mm/px · z∈[-143,+94]mm · 3 of 80 slices shown (4 of 5)]
[im 1/80]
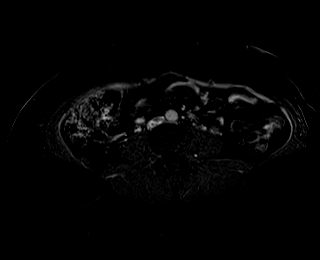
[im 40/80]
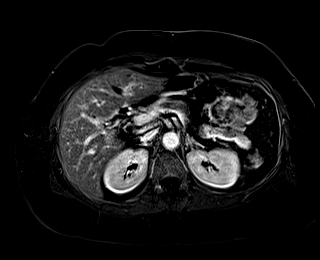
[im 80/80]
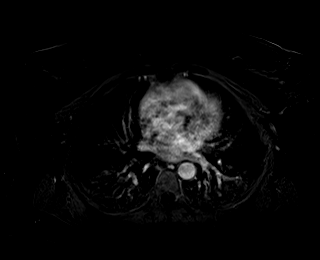

[Series 24: T1 dynamic post-contrast · coronal · 3.0mm · 1.31mm/px · 3 of 72 slices shown]
[im 1/72]
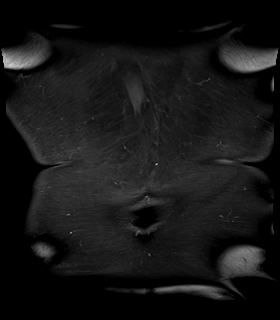
[im 36/72]
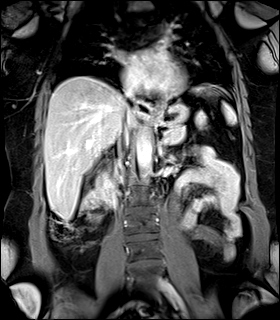
[im 72/72]
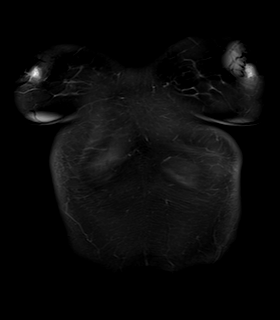

[Series 25: T1 dynamic fat-sat post-contrast · axial · 3.0mm · 1.19mm/px · z∈[-143,+94]mm · 3 of 80 slices shown (4 of 4)]
[im 1/80]
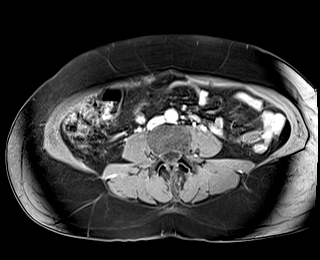
[im 40/80]
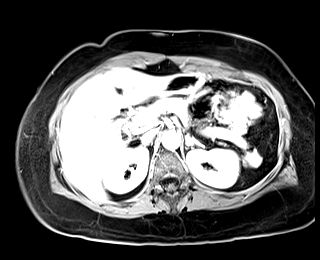
[im 80/80]
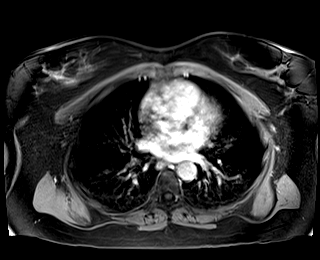

[Series 26: T1 dynamic fat-sat · axial · 3.0mm · 1.19mm/px · z∈[-143,+94]mm · 3 of 80 slices shown (5 of 5)]
[im 1/80]
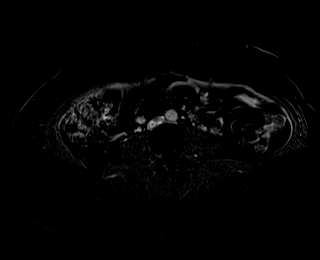
[im 40/80]
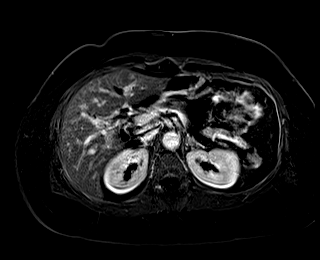
[im 80/80]
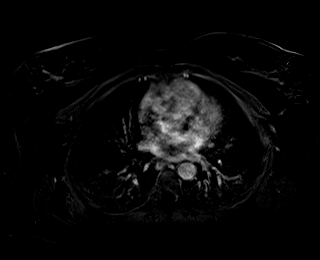

[45 of 48 positions shown; findings below may reference images not displayed]

FINDINGS: Lower chest: Unremarkable.

Hepatobiliary: Liver measures 19.2 cm craniocaudal length, enlarged.
9 mm T1 hypointense, T2 hyperintense lesion in the dome of the right
liver shows peripheral nodular enhancement on early postcontrast
imaging with diffuse filling in of enhancement on delayed imaging,
consistent with hemangioma. 8 mm lesion in the lateral segment left
liver with similar imaging characteristics is also consistent with
benign cavernous hemangioma and corresponds to the abnormality seen
on ultrasound earlier today. Layering tiny gallstones evident (axial
T2 image 23 of series 9) with subtle adjacent pericholecystic fluid
([DATE]) the tracks into the hepatoduodenal ligament (image [DATE]). No
intrahepatic or extrahepatic biliary dilation. Common bile duct
diameter 5 mm in the pancreatic head. MRCP imaging shows no evidence
for choledocholithiasis.

Pancreas: No focal mass lesion. No dilatation of the main duct. No
intraparenchymal cyst. No peripancreatic edema.

Spleen:  No splenomegaly. No focal mass lesion.

Adrenals/Urinary Tract: No adrenal nodule or mass. Kidneys
unremarkable.

Stomach/Bowel: Stomach is unremarkable. No gastric wall thickening.
No evidence of outlet obstruction. Duodenum is normally positioned
as is the ligament of Treitz. No small bowel or colonic dilatation
within the visualized abdomen.

Vascular/Lymphatic: No abdominal aortic aneurysm. No abdominal
aortic atherosclerotic calcification. There is no gastrohepatic or
hepatoduodenal ligament lymphadenopathy. No retroperitoneal or
mesenteric lymphadenopathy.

Other:  No substantial intraperitoneal free fluid.

Musculoskeletal: No focal suspicious marrow enhancement within the
visualized bony anatomy.
IMPRESSION: 1. Cholelithiasis with subtle pericholecystic fluid. Acute
cholecystitis could have this appearance. If there is clinical
concern for cystic duct obstruction, nuclear scintigraphy could be
used to further evaluate.
2. No choledocholithiasis. No intrahepatic or extrahepatic biliary
dilation.
3. Tiny benign cavernous hemangiomas in the dome of the liver and
left hepatic lobe.

## 2023-11-13 ENCOUNTER — Other Ambulatory Visit: Payer: Self-pay | Admitting: Internal Medicine

## 2023-11-13 DIAGNOSIS — Z1231 Encounter for screening mammogram for malignant neoplasm of breast: Secondary | ICD-10-CM

## 2024-06-17 ENCOUNTER — Ambulatory Visit
Admission: RE | Admit: 2024-06-17 | Discharge: 2024-06-17 | Disposition: A | Discharge status: Home / Self Care | Payer: Self-pay | Source: Ambulatory Visit | Attending: Internal Medicine | Admitting: Internal Medicine

## 2024-06-17 DIAGNOSIS — Z1231 Encounter for screening mammogram for malignant neoplasm of breast: Secondary | ICD-10-CM | POA: Insufficient documentation
